# Patient Record
Sex: Female | Born: 1995 | Race: White | Hispanic: No | Marital: Married | Smoking: Never smoker
Health system: Southern US, Community
[De-identification: ages and names within clinical notes are randomized; demographics above are authoritative.]

## PROBLEM LIST (undated history)

## (undated) ENCOUNTER — Inpatient Hospital Stay (HOSPITAL_COMMUNITY): Payer: Self-pay

## (undated) DIAGNOSIS — D649 Anemia, unspecified: Secondary | ICD-10-CM

## (undated) DIAGNOSIS — J45909 Unspecified asthma, uncomplicated: Secondary | ICD-10-CM

## (undated) DIAGNOSIS — G43909 Migraine, unspecified, not intractable, without status migrainosus: Secondary | ICD-10-CM

## (undated) DIAGNOSIS — R51 Headache: Secondary | ICD-10-CM

## (undated) DIAGNOSIS — R519 Headache, unspecified: Secondary | ICD-10-CM

## (undated) HISTORY — DX: Migraine, unspecified, not intractable, without status migrainosus: G43.909

## (undated) HISTORY — PX: ADENOIDECTOMY: SUR15

## (undated) HISTORY — PX: DERMOID CYST  EXCISION: SHX1452

## (undated) HISTORY — PX: WISDOM TOOTH EXTRACTION: SHX21

## (undated) HISTORY — PX: TYMPANOSTOMY TUBE PLACEMENT: SHX32

## (undated) HISTORY — DX: Headache: R51

## (undated) HISTORY — PX: TONSILLECTOMY: SUR1361

## (undated) HISTORY — DX: Headache, unspecified: R51.9

---
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
## 2016-07-12 ENCOUNTER — Encounter: Payer: Self-pay | Admitting: Family

## 2016-07-12 ENCOUNTER — Ambulatory Visit (INDEPENDENT_AMBULATORY_CARE_PROVIDER_SITE_OTHER): Payer: BLUE CROSS/BLUE SHIELD | Admitting: Family

## 2016-07-12 DIAGNOSIS — R1030 Lower abdominal pain, unspecified: Secondary | ICD-10-CM | POA: Diagnosis not present

## 2016-07-12 DIAGNOSIS — M549 Dorsalgia, unspecified: Secondary | ICD-10-CM | POA: Diagnosis not present

## 2016-07-12 LAB — POCT URINALYSIS DIPSTICK
Bilirubin, UA: NEGATIVE
Blood, UA: NEGATIVE
GLUCOSE UA: NEGATIVE
KETONES UA: NEGATIVE
LEUKOCYTES UA: NEGATIVE
NITRITE UA: NEGATIVE
Protein, UA: NEGATIVE
Spec Grav, UA: 1.02
Urobilinogen, UA: NEGATIVE
pH, UA: 5

## 2016-07-12 NOTE — Progress Notes (Signed)
Subjective:     Patient ID: Valerie Black, female   DOB: [birth year], 20 y.o.   MRN: [id number]  HPI 20 y.o. Female presents with chief complaint of tape worm. She states that she has "horrible" stomach issues and they have been ongoing for over one year. She was diagnosed and treated for H. Pylori twice, although one time her stool culture for it was lost according to her mom. She reports that she has brief periods of constipation and then she will have normal stool, but her lower abdomen is always sore. She reports that it looks like there is "white tissue" in her stool. Over the past year she reports her weight has gone from 160 to 126 at its lowest. She feels like she is eating all the time but cannot gain weight back. She states that recently her lower back has been sore as well, she denies burning with urination. Denies fever, fatigue, SOB and change in activity.    Review of Systems  Constitutional: Negative.  Negative for activity change, appetite change, fatigue and fever.  HENT: Negative.  Negative for rhinorrhea, sinus pressure and sore throat.   Eyes: Negative.   Respiratory: Negative.  Negative for cough, chest tightness and shortness of breath.   Cardiovascular: Negative.  Negative for chest pain and palpitations.  Gastrointestinal: Positive for abdominal distention, abdominal pain and constipation. Negative for anal bleeding, blood in stool, diarrhea, nausea, rectal pain and vomiting.  Endocrine: Negative.  Negative for polydipsia, polyphagia and polyuria.  Genitourinary: Positive for flank pain. Negative for decreased urine volume, difficulty urinating, dysuria, frequency, hematuria, vaginal bleeding, vaginal discharge and vaginal pain.  Musculoskeletal: Positive for back pain. Negative for gait problem, joint swelling, myalgias, neck pain and neck stiffness.  Skin: Negative.  Negative for color change and rash.  Neurological: Negative.  Negative for dizziness, weakness,  light-headedness and headaches.  Hematological: Negative.    No past medical history on file.  Social History   Social History  . Marital status: Single    Spouse name: N/A  . Number of children: N/A  . Years of education: N/A   Occupational History  . Not on file.   Social History Main Topics  . Smoking status: Never Smoker  . Smokeless tobacco: Not on file  . Alcohol use Not on file  . Drug use: Unknown  . Sexual activity: Not on file   Other Topics Concern  . Not on file   Social History Narrative  . No narrative on file    Past Surgical History:  Procedure Laterality Date  . ADENOIDECTOMY    . DERMOID CYST  EXCISION     age 37 years (neck)  . TONSILLECTOMY    . TYMPANOSTOMY TUBE PLACEMENT     20 years of age    No family history on file.  No Known Allergies  Current Outpatient Prescriptions on File Prior to Visit  Medication Sig Dispense Refill  . albuterol (PROVENTIL HFA;VENTOLIN HFA) 108 (90 BASE) MCG/ACT inhaler Inhale 2 puffs into the lungs every 4 (four) hours as needed for wheezing or shortness of breath (cough, tight chest). 1 Inhaler 1  . beclomethasone (QVAR) 40 MCG/ACT inhaler Inhale 2 puffs into the lungs 2 (two) times daily. 1 Inhaler 12  . fluticasone (FLONASE) 50 MCG/ACT nasal spray Place 2 sprays into the nose daily. 16 g 1  . Lactobacillus Rhamnosus, GG, (CULTURELLE KIDS) CHEW Chew 1 capsule by mouth daily. One a day    . ondansetron [facility](ZOFRAN  ODT) 8 MG disintegrating tablet Take 1 tablet (8 mg total) by mouth every 8 (eight) hours as needed for nausea or vomiting. 20 tablet 0   No current facility-administered medications on file prior to visit.     There were no vitals taken for this visit.chart     Objective:   Physical Exam  Constitutional: She is oriented to person, place, and time. She is active.  Neck: Trachea normal, normal range of motion and full passive range of motion without pain. Neck supple.  Cardiovascular: Normal rate,  regular rhythm, normal heart sounds and normal pulses.   Pulmonary/Chest: Effort normal and breath sounds normal. She has no decreased breath sounds. She has no wheezes. She has no rhonchi. She has no rales.  Abdominal: Normal appearance and bowel sounds are normal. She exhibits no distension. There is no hepatosplenomegaly. There is tenderness in the right lower quadrant and left lower quadrant. There is no rigidity, no rebound, no guarding, no CVA tenderness, no tenderness at McBurney's point and negative Murphy's sign.  Lymphadenopathy:    She has no cervical adenopathy.  Neurological: She is alert and oriented to person, place, and time. She has normal strength.  Skin: Skin is warm, dry and intact. No rash noted.     Results for orders placed or performed in visit on 07/12/16  POCT urinalysis dipstick  Result Value Ref Range   Color, UA yellow    Clarity, UA clear    Glucose, UA neg    Bilirubin, UA neg    Ketones, UA neg    Spec Grav, UA 1.020    Blood, UA neg    pH, UA 5.0    Protein, UA neg    Urobilinogen, UA negative    Nitrite, UA neg    Leukocytes, UA Negative Negative       Assessment:     Abdominal pain (rule out parasite such as tape worm) Back pain     Plan:      UA normal  Will culture stool for parasites  Discussed adequate hydration and nutrition  Possible referral to GI pending stool culture.  Follow up as needed.

## 2016-07-12 NOTE — Patient Instructions (Signed)

## 2016-07-17 ENCOUNTER — Other Ambulatory Visit: Payer: Self-pay | Admitting: Pediatrics

## 2016-07-17 DIAGNOSIS — R1013 Epigastric pain: Secondary | ICD-10-CM

## 2016-07-27 ENCOUNTER — Telehealth: Payer: Self-pay | Admitting: Pediatrics

## 2016-07-27 NOTE — Telephone Encounter (Signed)
Mom would like the results of lab work she has called and no one has called her back.

## 2016-07-28 NOTE — Telephone Encounter (Signed)
Two phone calls to mom--left messages--she did not answer her phone

## 2016-07-29 ENCOUNTER — Telehealth: Payer: Self-pay | Admitting: Pediatrics

## 2016-07-29 NOTE — Telephone Encounter (Signed)
Mother stated that she missed your call yesterday and would like to know child's test results

## 2016-07-30 NOTE — Telephone Encounter (Signed)
Mom advised that results of stools were negative for ova and parasites

## 2017-01-27 HISTORY — PX: COSMETIC SURGERY: SHX468

## 2017-06-15 ENCOUNTER — Telehealth: Payer: Self-pay | Admitting: Pediatrics

## 2017-06-15 MED ORDER — MUPIROCIN 2 % EX OINT: 1.0000 "application " | TOPICAL_OINTMENT | Freq: Three times a day (TID) | CUTANEOUS | 0 refills | Status: DC

## 2017-06-15 MED ORDER — SULFAMETHOXAZOLE-TRIMETHOPRIM 800-160 MG PO TABS: 1.0000 | ORAL_TABLET | Freq: Two times a day (BID) | ORAL | 0 refills | Status: AC

## 2017-06-15 NOTE — Telephone Encounter (Signed)
Sister with impetigo and now she is starting to get areas on her face and body like she did.  She has her wedding in a week and they are worried it is going to get worse like her sisters did.  Denies any allergies to any medications.  Will send in antibiotics for treatment.

## 2017-07-06 ENCOUNTER — Ambulatory Visit (INDEPENDENT_AMBULATORY_CARE_PROVIDER_SITE_OTHER): Payer: BLUE CROSS/BLUE SHIELD | Admitting: Pediatrics

## 2017-07-06 ENCOUNTER — Encounter: Payer: Self-pay | Admitting: Pediatrics

## 2017-07-06 VITALS — Wt 132.6 lb

## 2017-07-06 DIAGNOSIS — L0100 Impetigo, unspecified: Secondary | ICD-10-CM | POA: Diagnosis not present

## 2017-07-06 MED ORDER — MUPIROCIN 2 % EX OINT: TOPICAL_OINTMENT | CUTANEOUS | 2 refills | Status: AC

## 2017-07-06 MED ORDER — CEPHALEXIN 500 MG PO CAPS: 500.0000 mg | ORAL_CAPSULE | Freq: Three times a day (TID) | ORAL | 0 refills | Status: AC

## 2017-07-06 NOTE — Progress Notes (Signed)
Presents with red papules to exposed area of body for the past three days. Low grade fever, no discharge, no swelling and no limitation of motion.   Review of Systems  Constitutional: Negative.  Negative for fever, activity change and appetite change.  HENT: Negative.  Negative for ear pain, congestion and rhinorrhea.   Eyes: Negative.   Respiratory: Negative.  Negative for cough and wheezing.   Cardiovascular: Negative.   Gastrointestinal: Negative.   Musculoskeletal: Negative.  Negative for myalgias, joint swelling and gait problem.  Neurological: Negative for numbness.  Hematological: Negative for adenopathy. Does not bruise/bleed easily.        Objective:   Physical Exam  Constitutional: Appears well-developed and well-nourished. Active. No distress.  HENT:  Right Ear: Tympanic membrane normal.  Left Ear: Tympanic membrane normal.  Nose: No nasal discharge.  Mouth/Throat: Mucous membranes are moist. No tonsillar exudate. Oropharynx is clear. Pharynx is normal.  Eyes: Pupils are equal, round, and reactive to light.  Neck: Normal range of motion. No adenopathy.  Cardiovascular: Regular rhythm.  No murmur heard. Pulmonary/Chest: Effort normal. No respiratory distress. She exhibits no retraction.  Abdominal: Soft. Bowel sounds are normal. Exhibits no distension.   Neurological: Alert and active.  Skin: Skin is warm. No petechiae. Papular rash with scabsto exposed skin loikely secondary to bug bites. No swelling, no erythema and no discharge.      Assessment:     Impetigo secondary to bug bites    Plan:   Will treat with oral keflex and  topical bactroban ointment and advised her on cutting nails and asked to avoid scratching.

## 2017-07-06 NOTE — Patient Instructions (Signed)
Impetigo, Adult Impetigo is an infection of the skin. It commonly occurs in young children, but it can also occur in adults. The infection causes itchy blisters and sores that produce brownish-yellow fluid. As the fluid dries, it forms a thick, honey-colored crust. These skin changes usually occur on the face but can also affect other areas of the body. Impetigo usually goes away in 7-10 days with treatment. What are the causes? Impetigo is caused by two types of bacteria. It may be caused by staphylococci or streptococci bacteria. These bacteria cause impetigo when they get under the surface of the skin. This often happens after some damage to the skin, such as damage from:  Cuts, scrapes, or scratches.  Insect bites, especially when you scratch the area of a bite.  Chickenpox or other illnesses that cause open skin sores.  Nail biting or chewing.  Impetigo is contagious and can spread easily from one person to another. This may occur through close skin contact or by sharing towels, clothing, or other items with a person who has the infection. What increases the risk? Some things that can increase the risk of getting this infection include:  Playing sports that include skin-to-skin contact with others.  Having a skin condition with open sores.  Having many skin cuts or scrapes.  Living in an area that has high humidity levels.  Having poor hygiene.  Having high levels of staphylococci in your nose.  What are the signs or symptoms? Impetigo usually starts out as small blisters, often on the face. The blisters then break open and turn into tiny sores (lesions) with a yellow crust. In some cases, the blisters cause itching or burning. With scratching, irritation, or lack of treatment, these small lesions may get larger. Scratching can also cause impetigo to spread to other parts of the body. The bacteria can get under the fingernails and spread when you touch another area of your  skin. Other possible symptoms include:  Larger blisters.  Pus.  Swollen lymph glands.  How is this diagnosed? This condition is usually diagnosed during a physical exam. A skin sample or sample of fluid from a blister may be taken for lab tests that involve growing bacteria (culture test). This can help confirm the diagnosis or help determine the best treatment. How is this treated? Mild impetigo can be treated with prescription antibiotic cream. Oral antibiotic medicine may be used in more severe cases. Medicines for itching may also be used. Follow these instructions at home:  Take medicines only as directed by your health care provider.  To help prevent impetigo from spreading to other body areas: ? Keep your fingernails short and clean. ? Do not scratch the blisters or sores. ? Cover infected areas, if necessary, to keep from scratching.  Gently wash the infected areas with antibiotic soap and water.  Soak crusted areas in warm, soapy water using antibiotic soap. ? Gently rub the areas to remove crusts. Do not scrub.  Wash your hands often to avoid spreading this infection.  Stay home until you have used an antibiotic cream for 48 hours (2 days) or an oral antibiotic medicine for 24 hours (1 day). You should only return to work and activities with other people if your skin shows significant improvement. How is this prevented? To keep the infection from spreading:  Stay home until you have used an antibiotic cream for 48 hours or an oral antibiotic for 24 hours.  Wash your hands often.  Do not engage in   skin-to-skin contact with other people while you have still have blisters.  Do not share towels, washcloths, or bedding with others while you have the infection.  Contact a health care provider if:  You develop more blisters or sores despite treatment.  Other family members get sores.  Your skin sores are not improving after 48 hours of treatment.  You have a  fever. Get help right away if:  You see spreading redness or swelling of the skin around your sores.  You see red streaks coming from your sores.  You develop a sore throat. This information is not intended to replace advice given to you by your health care provider. Make sure you discuss any questions you have with your health care provider. Document Released: 12/06/2014 Document Revised: 04/22/2016 Document Reviewed: 10/29/2014 Elsevier Interactive Patient Education  2017 Elsevier Inc[employer]  

## 2018-05-18 ENCOUNTER — Ambulatory Visit: Payer: BLUE CROSS/BLUE SHIELD | Admitting: Family Medicine

## 2018-05-18 ENCOUNTER — Encounter: Payer: Self-pay | Admitting: Family Medicine

## 2018-05-18 VITALS — BP 120/70 | HR 68 | Temp 98.7°F | Ht 69.0 in | Wt 140.5 lb

## 2018-05-18 DIAGNOSIS — L03312 Cellulitis of back [any part except buttock]: Secondary | ICD-10-CM | POA: Diagnosis not present

## 2018-05-18 MED ORDER — DOXYCYCLINE HYCLATE 100 MG PO TABS
100.0000 mg | ORAL_TABLET | Freq: Two times a day (BID) | ORAL | 0 refills | Status: DC
Start: 2018-05-18 — End: 2018-06-14

## 2018-05-18 NOTE — Patient Instructions (Signed)
Doxycycline tablets or capsules What is this medicine? DOXYCYCLINE (dox i SYE kleen) is a tetracycline antibiotic. It kills certain bacteria or stops their growth. It is used to treat many kinds of infections, like dental, skin, respiratory, and urinary tract infections. It also treats acne, Lyme disease, malaria, and certain sexually transmitted infections. This medicine may be used for other purposes; ask your health care provider or pharmacist if you have questions. COMMON BRAND NAME(S): Acticlate, Adoxa, Adoxa CK, Adoxa Pak, Adoxa TT, Alodox, Avidoxy, Doxal, Mondoxyne NL, Monodox, Morgidox 1x, Morgidox 1x Kit, Morgidox 2x, Morgidox 2x Kit, NutriDox, Ocudox, TARGADOX, Vibra-Tabs, Vibramycin What should I tell my health care provider before I take this medicine? They need to know if you have any of these conditions: -liver disease -long exposure to sunlight like working outdoors -stomach problems like colitis -an unusual or allergic reaction to doxycycline, tetracycline antibiotics, other medicines, foods, dyes, or preservatives -pregnant or trying to get pregnant -breast-feeding How should I use this medicine? Take this medicine by mouth with a full glass of water. Follow the directions on the prescription label. It is best to take this medicine without food, but if it upsets your stomach take it with food. Take your medicine at regular intervals. Do not take your medicine more often than directed. Take all of your medicine as directed even if you think you are better. Do not skip doses or stop your medicine early. Talk to your pediatrician regarding the use of this medicine in children. While this drug may be prescribed for selected conditions, precautions do apply. Overdosage: If you think you have taken too much of this medicine contact a poison control center or emergency room at once. NOTE: This medicine is only for you. Do not share this medicine with others. What if I miss a dose? If you  miss a dose, take it as soon as you can. If it is almost time for your next dose, take only that dose. Do not take double or extra doses. What may interact with this medicine? -antacids -barbiturates -birth control pills -bismuth subsalicylate -carbamazepine -methoxyflurane -other antibiotics -phenytoin -vitamins that contain iron -warfarin This list may not describe all possible interactions. Give your health care provider a list of all the medicines, herbs, non-prescription drugs, or dietary supplements you use. Also tell them if you smoke, drink alcohol, or use illegal drugs. Some items may interact with your medicine. What should I watch for while using this medicine? Tell your doctor or health care professional if your symptoms do not improve. Do not treat diarrhea with over the counter products. Contact your doctor if you have diarrhea that lasts more than 2 days or if it is severe and watery. Do not take this medicine just before going to bed. It may not dissolve properly when you lay down and can cause pain in your throat. Drink plenty of fluids while taking this medicine to also help reduce irritation in your throat. This medicine can make you more sensitive to the sun. Keep out of the sun. If you cannot avoid being in the sun, wear protective clothing and use sunscreen. Do not use sun lamps or tanning beds/booths. Birth control pills may not work properly while you are taking this medicine. Talk to your doctor about using an extra method of birth control. If you are being treated for a sexually transmitted infection, avoid sexual contact until you have finished your treatment. Your sexual partner may also need treatment. Avoid antacids, aluminum, calcium, magnesium, and  iron products for 4 hours before and 2 hours after taking a dose of this medicine. If you are using this medicine to prevent malaria, you should still protect yourself from contact with mosquitos. Stay in screened-in  areas, use mosquito nets, keep your body covered, and use an insect repellent. What side effects may I notice from receiving this medicine? Side effects that you should report to your doctor or health care professional as soon as possible: -allergic reactions like skin rash, itching or hives, swelling of the face, lips, or tongue -difficulty breathing -fever -itching in the rectal or genital area -pain on swallowing -redness, blistering, peeling or loosening of the skin, including inside the mouth -severe stomach pain or cramps -unusual bleeding or bruising -unusually weak or tired -yellowing of the eyes or skin Side effects that usually do not require medical attention (report to your doctor or health care professional if they continue or are bothersome): -diarrhea -loss of appetite -nausea, vomiting This list may not describe all possible side effects. Call your doctor for medical advice about side effects. You may report side effects to FDA at 1-800-FDA-1088. Where should I keep my medicine? Keep out of the reach of children. Store at room temperature, below 30 degrees C (86 degrees F). Protect from light. Keep container tightly closed. Throw away any unused medicine after the expiration date. Taking this medicine after the expiration date can make you seriously ill. NOTE: This sheet is a summary. It may not cover all possible information. If you have questions about this medicine, talk to your doctor, pharmacist, or health care provider.  2018 Elsevier/Gold Standard (2015-12-17 17:11:22)

## 2018-05-18 NOTE — Progress Notes (Signed)
Subjective:  Patient ID: Valerie Black, female    DOB: [birth year]  Age: 22 y.o. MRN: [id number]  CC: Establish Care (boil under right arm)   HPI Valerie Black presents for 2 to 3-day history of the swelling in her right upper back area.  There is been scant discharge.  There is been no streaking fever or chills.  Distant history of infected hair follicles.  She has no history of MRSA that she is aware of.  Outpatient Medications Prior to Visit  Medication Sig Dispense Refill  . TRI-PREVIFEM 0.18/0.215/0.25 MG-35 MCG tablet Take 1 tablet by mouth daily.  1  . albuterol (PROVENTIL HFA;VENTOLIN HFA) 108 (90 BASE) MCG/ACT inhaler Inhale 2 puffs into the lungs every 4 (four) hours as needed for wheezing or shortness of breath (cough, tight chest). 1 Inhaler 1  . beclomethasone (QVAR) 40 MCG/ACT inhaler Inhale 2 puffs into the lungs 2 (two) times daily. 1 Inhaler 12  . fluticasone (FLONASE) 50 MCG/ACT nasal spray Place 2 sprays into the nose daily. 16 g 1  . Lactobacillus Rhamnosus, GG, (CULTURELLE KIDS) CHEW Chew 1 capsule by mouth daily. One a day    . mupirocin ointment (BACTROBAN) 2 % Apply 1 application topically 3 (three) times daily. 22 g 0  . ondansetron (ZOFRAN ODT) 8 MG disintegrating tablet Take 1 tablet (8 mg total) by mouth every 8 (eight) hours as needed for nausea or vomiting. 20 tablet 0   No facility-administered medications prior to visit.     ROS Review of Systems  Constitutional: Negative for appetite change, chills, fatigue, fever and unexpected weight change.  Skin: Negative for color change, pallor, rash and wound.  Allergic/Immunologic: Negative for immunocompromised state.  Hematological: Negative for adenopathy. Does not bruise/bleed easily.  Psychiatric/Behavioral: Negative.     Objective:  BP 120/70   Pulse 68   Temp 98.7 F (37.1 C)   Ht 5\' 9"  (1.753 m)   Wt 140 lb 8 oz (63.7 kg)   LMP 05/04/2018 (Exact Date)   SpO2 97%   BMI 20.75 kg/m   BP  Readings from Last 3 Encounters:  05/18/18 120/70  06/12/15 117/59    Wt Readings from Last 3 Encounters:  05/18/18 140 lb 8 oz (63.7 kg)  07/06/17 132 lb 9.6 oz (60.1 kg) (58 %, Z= 0.20)*  06/12/15 130 lb 8.2 oz (59.2 kg) (63 %, Z= 0.33)*   * Growth percentiles are based on CDC (Girls, 2-20 Years) data.    Physical Exam  Constitutional: She is oriented to person, place, and time. She appears well-developed and well-nourished. No distress.  HENT:  Head: Normocephalic and atraumatic.  Right Ear: External ear normal.  Eyes: Right eye exhibits no discharge. Left eye exhibits no discharge. No scleral icterus.  Pulmonary/Chest: Breath sounds normal.  Neurological: She is alert and oriented to person, place, and time.  Skin: Skin is warm and dry. She is not diaphoretic.       Lab Results  Component Value Date   WBC 6.7 06/12/2015   HGB 13.2 06/12/2015   HCT 39.4 06/12/2015   PLT 170 06/12/2015   GLUCOSE 100 (H) 06/12/2015   ALT 14 06/12/2015   AST 17 06/12/2015   NA 141 06/12/2015   K 3.8 06/12/2015   CL 105 06/12/2015   CREATININE 0.74 06/12/2015   BUN 10 06/12/2015   CO2 28 06/12/2015    Ct Abdomen Pelvis W Contrast  Result Date: 06/12/2015 CLINICAL DATA:  22 year old with abdominal pain  and vomiting for 2 days. Fatigue. No response to antibiotic therapy. Initial encounter. EXAM: CT ABDOMEN AND PELVIS WITH CONTRAST TECHNIQUE: Multidetector CT imaging of the abdomen and pelvis was performed using the standard protocol following bolus administration of intravenous contrast. CONTRAST:  100mL OMNIPAQUE IOHEXOL 300 MG/ML  SOLN COMPARISON:  None. FINDINGS: Lower chest: Clear lung bases. No significant pleural or pericardial effusion. Hepatobiliary: The liver is normal in density without focal abnormality. No evidence of gallstones, gallbladder wall thickening or biliary dilatation. Pancreas: Unremarkable. No pancreatic ductal dilatation or surrounding inflammatory changes. Spleen:  Normal in size without focal abnormality. Adrenals/Urinary Tract: Both adrenal glands appear normal.The kidneys appear normal without evidence of urinary tract calculus, suspicious lesion or hydronephrosis. No bladder abnormalities are seen. Stomach/Bowel: No evidence of bowel wall thickening, distention or surrounding inflammatory change.The appendix appears normal, best seen on coronal images 46 and 47. Vascular/Lymphatic: Small ileocolonic mesenteric lymph nodes are not pathologically enlarged. No significant vascular findings are present. Reproductive: The uterus and ovaries appear normal. The uterus is retroverted. No inflammatory changes demonstrated within the pelvis. Other: No evidence of abdominal wall mass or hernia. Musculoskeletal: No acute or significant osseous findings. IMPRESSION: No acute findings or explanation for the patient's symptoms. The appendix appears normal. Electronically Signed   By: Valerie BullocksWilliam  Black M.D.   On: 06/12/2015 15:39    Assessment & Plan:   Valerie Black was seen today for establish care.  Diagnoses and all orders for this visit:  Cellulitis of back except buttock -     doxycycline (VIBRA-TABS) 100 MG tablet; Take 1 tablet (100 mg total) by mouth 2 (two) times daily.   I have discontinued Valerie Black's fluticasone, albuterol, beclomethasone, CULTURELLE KIDS, ondansetron, and mupirocin ointment. I am also having her start on doxycycline. Additionally, I am having her maintain her TRI-PREVIFEM.  Meds ordered this encounter  Medications  . doxycycline (VIBRA-TABS) 100 MG tablet    Sig: Take 1 tablet (100 mg total) by mouth 2 (two) times daily.    Dispense:  20 tablet    Refill:  0   Discussed treatment options with the patient.  First option is to treated with antibiotics and warm compresses for the next 2 to 3 days.  It may become worse and need drainage.  She will return to see me or go to urgent care.  It may open on his own and drain or resolve.  The  second option would be to for me to attempt to drain it today.  I explained the procedure to the patient.  The yield may be minimal but it could still help.  Patient elected to go with the first option.  She will use warm compresses 3 times a day.  She will follow-up with here with me or an urgent care depending on availability as needed.  Light caution emphasized.  Follow-up: Return in about 3 days (around 05/21/2018), or if symptoms worsen or fail to improve.  Mliss SaxWilliam Alfred Cristian Grieves, MD

## 2018-06-14 ENCOUNTER — Ambulatory Visit: Payer: BLUE CROSS/BLUE SHIELD | Admitting: Family Medicine

## 2018-06-14 ENCOUNTER — Encounter: Payer: Self-pay | Admitting: Family Medicine

## 2018-06-14 VITALS — BP 110/70 | HR 73 | Temp 98.0°F | Ht 69.0 in | Wt 140.2 lb

## 2018-06-14 DIAGNOSIS — A4902 Methicillin resistant Staphylococcus aureus infection, unspecified site: Secondary | ICD-10-CM | POA: Insufficient documentation

## 2018-06-14 MED ORDER — MUPIROCIN 2 % EX OINT: 1.0000 "application " | TOPICAL_OINTMENT | Freq: Two times a day (BID) | CUTANEOUS | 0 refills | Status: AC

## 2018-06-14 MED ORDER — CHLORHEXIDINE GLUCONATE 4 % EX LIQD: CUTANEOUS | 0 refills | Status: DC

## 2018-06-14 MED ORDER — DOXYCYCLINE HYCLATE 100 MG PO TABS
100.0000 mg | ORAL_TABLET | Freq: Two times a day (BID) | ORAL | 0 refills | Status: DC
Start: 2018-06-14 — End: 2018-08-11

## 2018-06-14 NOTE — Patient Instructions (Signed)
Community-Associated MRSA °MRSA stands for methicillin-resistant Staphylococcus aureus. It is a type ofinfection caused by bacteria that are no longer affected by common antibiotic medicines (drug-resistant bacteria). °Infections with MRSA can occur in hospitals and other health care settings (healthcare-associated MRSA), and in the community (community-associated MRSA, or CA-MRSA). MRSA bacteria can spread from person to person by touching contaminated objects or through direct contact. Infections with MRSA may be very serious or even life-threatening. °What are the causes? °Staphylococcus aureus bacteria normally live on the skin or in the nose of some people. This usually does not cause problems. However, a MRSA infection can happen if the bacterium enters the body through a cut, wound, or break in the skin. °What increases the risk? °The following factors may make you more likely to get a CA-MRSA infection: °· Close skin-to-skin contact with others. °· Cuts and scratches that are not treated, not covered, or both. °· Recent antibiotic medicine use. °· Sharing contaminated towels or clothes. °· Having active skin conditions. °· Participating in contact sports. °· Living in crowded settings. °· Homelessness. °· IV drug use. ° °What are the signs or symptoms? °This condition usually starts with a skin infection. Signs and symptoms vary, and may include: °· An area of skin that is red and swollen, feels painful, and is warm to the touch. °· Pus under the skin or pus draining from the infected area. °· Fever. ° °CA-MRSA infections are usually skin infections, but in some cases, severe illness may develop, such as: °· Pneumonia. °· Bone or joint infections. °· Bloodstream infections (sepsis). ° °Symptoms may vary as the infection gets worse. °How is this diagnosed? °This condition is diagnosed by taking a fluid sample (culture). The culture may come from: °· Swabs of cuts or wounds in infected areas. °· Nasal  swabs. °· Saliva or deep-cough specimens from the lungs (sputum). °· Urine. °· Blood. ° °You may have imaging tests to check whether the infection has spread. Imaging tests may include: °· X-rays. °· MRI. °· CT scan. ° °How is this treated? °Treatment varies and is based on how serious, deep, and extensive the infection is. For example: °· Some skin infections, such as a small boil or abscess, may be treated by draining pus from the site of the infection. °· Deeper or more widespread soft tissue infections are usually treated with surgery to drain pus and with antibiotics that are given through a vein or by mouth. This may be recommended even if you are pregnant. °· Serious infections may require a hospital stay. ° °If antibiotics are prescribed, you may need to take them for several weeks. °Follow these instructions at home: °Medicines °· Take your antibiotic as told by your health care provider. Do not stop taking the antibiotic even if you start to feel better. °· Take over-the-counter and prescription medicines only as told by your health care provider. °General instructions °· Wash your hands with soap and water often. Ask anyone who lives with you to wash their hands often, too. If soap and water are not available, use hand sanitizer. °· Do not use towels, razors, toothbrushes, bedding, or other items that will be used by others. °· Follow instructions from your health care provider about how to take care of your wound. Make sure you: °? Wash your hands with soap and water before you change your bandage (dressing). If soap and water are not available, use hand sanitizer. °? Change your dressing as told by your health care provider. °? Leave   stitches (sutures), skin glue, or adhesive strips in place. These skin closures may need to be in place for 2 weeks or longer. If adhesive strip edges start to loosen and curl up, you may trim the loose edges. Do not remove adhesive strips completely unless your health care  provider tells you to do that. °· Tell any health care providers who care for you that you have MRSA so they are aware of your infection. °· Keep all follow-up visits as told by your health care provider. This is important. °How is this prevented? ° °· Wash your hands frequently with soap and water for at least 20 seconds. If soap and water are not available, use hand sanitizer. Make sure that everyone in your household washes their hands, too. °· Wash and dry your clothes and bedding at the warmest temperatures that are recommended on the labels. °· Maintain good hygiene by bathing often and keeping your body clean. °· Clean wounds, cuts, and abrasions with soap and water and cover them with dry, germ-free (sterile) dressings until they heal. °· If you have a wound that seems to be infected, ask your health care provider if a culture should be done for MRSA and other bacteria. °· If you are breastfeeding, talk to your health care provider about MRSA. You may be asked to temporarily stop breastfeeding. °Contact a health care provider if: °· Your infection seems to be getting worse. Signs may include: °? More warmth, redness, or tenderness around your wound site. °? A red line that spreads from your infection site. °? A dark color in the area around your infection. °? Wound drainage that is tan, yellow, or green. °? A bad smell coming from your wound. °· You feel nauseous, you vomit, or you cannot take medicine without vomiting. °· You have a fever. °· You have difficulty breathing. °This information is not intended to replace advice given to you by your health care provider. Make sure you discuss any questions you have with your health care provider. °Document Released: 02/18/2006 Document Revised: 07/09/2016 Document Reviewed: 11/20/2015 °Elsevier Interactive Patient Education © 2017 [employer] ° °

## 2018-06-14 NOTE — Progress Notes (Signed)
Subjective:  Patient ID: Anselm JunglingAnnaLee R Carroll, female    DOB: [birth year]  Age: 22 y.o. MRN: [id number]  CC: Lung Abcess   HPI Israella Luster LandsbergR Taliercio presents for evaluation and treatment of 3 tender spots in her right underarm area.  This involved relatively quickly over the last 2 to 3 days.  They are sore tender and seem to be inflamed.  There was no injury to this area other than shaving her underarms.  She is running no fever with this.  These areas are not drained.  There is no nausea or vomiting, fever or chills.  She is treated them with warm compresses.  She saw me for a similar lesion 2 weeks ago that resolved with doxycycline therapy.  She had no problems taking the doxycycline.  She is married and lives with her husband.  He is not affected.  Outpatient Medications Prior to Visit  Medication Sig Dispense Refill  . TRI-PREVIFEM 0.18/0.215/0.25 MG-35 MCG tablet Take 1 tablet by mouth daily.  1  . doxycycline (VIBRA-TABS) 100 MG tablet Take 1 tablet (100 mg total) by mouth 2 (two) times daily. 20 tablet 0   No facility-administered medications prior to visit.     ROS Review of Systems  Constitutional: Negative for chills, fever and unexpected weight change.  HENT: Negative.   Eyes: Negative.   Respiratory: Negative.   Cardiovascular: Negative.   Gastrointestinal: Negative.   Genitourinary: Negative for difficulty urinating and dysuria.  Musculoskeletal: Negative for arthralgias and myalgias.  Skin: Positive for color change and rash. Negative for pallor and wound.  Allergic/Immunologic: Negative for immunocompromised state.  Neurological: Negative for weakness and headaches.  Hematological: Negative for adenopathy. Does not bruise/bleed easily.  Psychiatric/Behavioral: Negative.     Objective:  BP 110/70   Pulse 73   Temp 98 F (36.7 C)   Ht 5\' 9"  (1.753 m)   Wt 140 lb 4 oz (63.6 kg)   SpO2 97%   BMI 20.71 kg/m   BP Readings from Last 3 Encounters:  06/14/18 110/70    05/18/18 120/70  06/12/15 117/59    Wt Readings from Last 3 Encounters:  06/14/18 140 lb 4 oz (63.6 kg)  05/18/18 140 lb 8 oz (63.7 kg)  07/06/17 132 lb 9.6 oz (60.1 kg) (58 %, Z= 0.20)*   * Growth percentiles are based on CDC (Girls, 2-20 Years) data.    Physical Exam  Constitutional: She is oriented to person, place, and time. She appears well-developed and well-nourished. No distress.  HENT:  Head: Normocephalic and atraumatic.  Right Ear: External ear normal.  Left Ear: External ear normal.  Nose: Nose normal.  Eyes: Right eye exhibits no discharge. Left eye exhibits no discharge. No scleral icterus.  Neck: No tracheal deviation present.  Pulmonary/Chest: Effort normal.  Neurological: She is alert and oriented to person, place, and time.  Skin: Skin is warm and dry. Rash noted. She is not diaphoretic. There is erythema. No pallor.  Psychiatric: She has a normal mood and affect. Her behavior is normal.    Lab Results  Component Value Date   WBC 6.7 06/12/2015   HGB 13.2 06/12/2015   HCT 39.4 06/12/2015   PLT 170 06/12/2015   GLUCOSE 100 (H) 06/12/2015   ALT 14 06/12/2015   AST 17 06/12/2015   NA 141 06/12/2015   K 3.8 06/12/2015   CL 105 06/12/2015   CREATININE 0.74 06/12/2015   BUN 10 06/12/2015   CO2 28 06/12/2015  Ct Abdomen Pelvis W Contrast  Result Date: 06/12/2015 CLINICAL DATA:  22 year old with abdominal pain and vomiting for 2 days. Fatigue. No response to antibiotic therapy. Initial encounter. EXAM: CT ABDOMEN AND PELVIS WITH CONTRAST TECHNIQUE: Multidetector CT imaging of the abdomen and pelvis was performed using the standard protocol following bolus administration of intravenous contrast. CONTRAST:  OMNIPAQUE IOHEXOL 300 MG/ML  SOLN COMPARISON:  None. FINDINGS: Lower chest: Clear lung bases. No significant pleural or pericardial effusion. Hepatobiliary: The liver is normal in density without focal abnormality. No evidence of gallstones,  gallbladder wall thickening or biliary dilatation. Pancreas: Unremarkable. No pancreatic ductal dilatation or surrounding inflammatory changes. Spleen: Normal in size without focal abnormality. Adrenals/Urinary Tract: Both adrenal glands appear normal.The kidneys appear normal without evidence of urinary tract calculus, suspicious lesion or hydronephrosis. No bladder abnormalities are seen. Stomach/Bowel: No evidence of bowel wall thickening, distention or surrounding inflammatory change.The appendix appears normal, best seen on coronal images 46 and 47. Vascular/Lymphatic: Small ileocolonic mesenteric lymph nodes are not pathologically enlarged. No significant vascular findings are present. Reproductive: The uterus and ovaries appear normal. The uterus is retroverted. No inflammatory changes demonstrated within the pelvis. Other: No evidence of abdominal wall mass or hernia. Musculoskeletal: No acute or significant osseous findings. IMPRESSION: No acute findings or explanation for the patient's symptoms. The appendix appears normal. Electronically Signed   By: Carey Bullocks M.D.   On: 06/12/2015 15:39    Assessment & Plan:   Geselle was seen today for lung abcess.  Diagnoses and all orders for this visit:  MRSA (methicillin resistant Staphylococcus aureus) -     Wound culture -     doxycycline (VIBRA-TABS) 100 MG tablet; Take 1 tablet (100 mg total) by mouth 2 (two) times daily. -     mupirocin ointment (BACTROBAN) 2 %; Place 1 application into the nose 2 (two) times daily for 5 days. -     chlorhexidine (HIBICLENS) 4 % external liquid; Apply to body from neck down and allow to set for 10 minutes and then wash off.   I have discontinued Caylynn R. Dossett's doxycycline. I am also having her start on doxycycline, mupirocin ointment, and chlorhexidine. Additionally, I am having her maintain her TRI-PREVIFEM.  Meds ordered this encounter  Medications  . doxycycline (VIBRA-TABS) 100 MG tablet     Sig: Take 1 tablet (100 mg total) by mouth 2 (two) times daily.    Dispense:  20 tablet    Refill:  0  . mupirocin ointment (BACTROBAN) 2 %    Sig: Place 1 application into the nose 2 (two) times daily for 5 days.    Dispense:  22 g    Refill:  0  . chlorhexidine (HIBICLENS) 4 % external liquid    Sig: Apply to body from neck down and allow to set for 10 minutes and then wash off.    Dispense:  120 mL    Refill:  0   Procedure: Pustule in rt axillary area was cw etoh prep x 3. #15 blade was used to unroof pustule and pus was drained from wound with direct pressure and collected for culture.   Information was given to the patient about MRSA.  She will follow-up as needed.  Follow-up: Return if symptoms worsen or fail to improve.  Mliss Sax, MD

## 2018-06-17 LAB — WOUND CULTURE
MICRO NUMBER:: 90847979
SPECIMEN QUALITY:: ADEQUATE

## 2018-07-21 ENCOUNTER — Other Ambulatory Visit: Payer: Self-pay

## 2018-07-21 ENCOUNTER — Emergency Department (HOSPITAL_COMMUNITY): Payer: BLUE CROSS/BLUE SHIELD

## 2018-07-21 ENCOUNTER — Emergency Department (HOSPITAL_COMMUNITY)
Admission: EM | Admit: 2018-07-21 | Discharge: 2018-07-21 | Disposition: A | Discharge status: Home / Self Care | Payer: BLUE CROSS/BLUE SHIELD | Attending: Emergency Medicine | Admitting: Emergency Medicine

## 2018-07-21 ENCOUNTER — Encounter (HOSPITAL_COMMUNITY): Payer: Self-pay | Admitting: Emergency Medicine

## 2018-07-21 DIAGNOSIS — M25511 Pain in right shoulder: Secondary | ICD-10-CM | POA: Diagnosis present

## 2018-07-21 DIAGNOSIS — Y92410 Unspecified street and highway as the place of occurrence of the external cause: Secondary | ICD-10-CM | POA: Diagnosis not present

## 2018-07-21 DIAGNOSIS — G44309 Post-traumatic headache, unspecified, not intractable: Secondary | ICD-10-CM | POA: Diagnosis not present

## 2018-07-21 DIAGNOSIS — T148XXA Other injury of unspecified body region, initial encounter: Secondary | ICD-10-CM

## 2018-07-21 DIAGNOSIS — Y9389 Activity, other specified: Secondary | ICD-10-CM | POA: Insufficient documentation

## 2018-07-21 DIAGNOSIS — S46911A Strain of unspecified muscle, fascia and tendon at shoulder and upper arm level, right arm, initial encounter: Secondary | ICD-10-CM | POA: Diagnosis not present

## 2018-07-21 DIAGNOSIS — Y999 Unspecified external cause status: Secondary | ICD-10-CM | POA: Diagnosis not present

## 2018-07-21 LAB — URINALYSIS, ROUTINE W REFLEX MICROSCOPIC
BILIRUBIN URINE: NEGATIVE
Glucose, UA: NEGATIVE mg/dL
Hgb urine dipstick: NEGATIVE
KETONES UR: NEGATIVE mg/dL
Leukocytes, UA: NEGATIVE
NITRITE: NEGATIVE
PROTEIN: NEGATIVE mg/dL
Specific Gravity, Urine: 1.026 (ref 1.005–1.030)
pH: 6 (ref 5.0–8.0)

## 2018-07-21 LAB — POC URINE PREG, ED: PREG TEST UR: NEGATIVE

## 2018-07-21 MED ORDER — ACETAMINOPHEN 500 MG PO TABS
1000.0000 mg | ORAL_TABLET | Freq: Once | ORAL | Status: AC
  Administered 2018-07-21: 1000 mg via ORAL
  Filled 2018-07-21: qty 2

## 2018-07-21 MED ORDER — ONDANSETRON 4 MG PO TBDP
4.0000 mg | ORAL_TABLET | Freq: Once | ORAL | Status: AC
  Administered 2018-07-21: 4 mg via ORAL
  Filled 2018-07-21: qty 1

## 2018-07-21 MED ORDER — IBUPROFEN 800 MG PO TABS
800.0000 mg | ORAL_TABLET | Freq: Once | ORAL | Status: AC
  Administered 2018-07-21: 800 mg via ORAL
  Filled 2018-07-21: qty 1

## 2018-07-21 MED ORDER — METHOCARBAMOL 500 MG PO TABS: 500.0000 mg | ORAL_TABLET | Freq: Every evening | ORAL | 0 refills | Status: DC | PRN
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
## 2018-07-21 NOTE — Discharge Instructions (Signed)
Take ibuprofen 3 times a day with meals. Take 800 mg (4 tablets) at a time.  Do not take other anti-inflammatories at the same time (Advil, Motrin, naproxen, Aleve). You may supplement with Tylenol 3 times a day as well if you need further pain control. Use robaxin as needed for muscle stiffness or soreness.  Have caution, this may make you tired or groggy.  Do not drive or operate heavy machinery while taking this medicine. Use ice packs or heating pads if this helps control your pain. You will likely have continued muscle stiffness and soreness over the next couple days.  Follow-up with primary care in 1 week if your symptoms are not improving. Return to the emergency room if you develop vision changes, vomiting, slurred speech, numbness, loss of bowel or bladder control, or any new or worsening symptoms.

## 2018-08-11 ENCOUNTER — Ambulatory Visit: Payer: BLUE CROSS/BLUE SHIELD | Admitting: Nurse Practitioner

## 2018-08-11 ENCOUNTER — Encounter: Payer: Self-pay | Admitting: Nurse Practitioner

## 2018-08-11 VITALS — BP 102/62 | HR 88 | Temp 97.9°F | Ht 69.0 in | Wt 142.0 lb

## 2018-08-11 DIAGNOSIS — M545 Low back pain, unspecified: Secondary | ICD-10-CM

## 2018-08-11 DIAGNOSIS — R300 Dysuria: Secondary | ICD-10-CM | POA: Diagnosis not present

## 2018-08-11 LAB — POCT URINALYSIS DIPSTICK
Bilirubin, UA: NEGATIVE
Blood, UA: NEGATIVE
Glucose, UA: NEGATIVE
KETONES UA: NEGATIVE
Nitrite, UA: NEGATIVE
Protein, UA: POSITIVE — AB
SPEC GRAV UA: 1.015 (ref 1.010–1.025)
Urobilinogen, UA: 0.2 E.U./dL
pH, UA: 6 (ref 5.0–8.0)

## 2018-08-11 LAB — POCT URINE PREGNANCY: Preg Test, Ur: NEGATIVE

## 2018-08-11 MED ORDER — CEPHALEXIN 500 MG PO CAPS: 500.0000 mg | ORAL_CAPSULE | Freq: Two times a day (BID) | ORAL | 0 refills | Status: DC

## 2018-08-11 MED ORDER — PHENAZOPYRIDINE HCL 95 MG PO TABS: 95.0000 mg | ORAL_TABLET | Freq: Three times a day (TID) | ORAL | 0 refills | Status: DC | PRN
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
## 2018-08-11 NOTE — Patient Instructions (Signed)

## 2018-08-13 LAB — URINE CULTURE
MICRO NUMBER: 91100247
SPECIMEN QUALITY:: ADEQUATE
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
## 2021-11-29 NOTE — L&D Delivery Note (Signed)
   Delivery Note:   G1P0 at [redacted]w[redacted]d  Admitting diagnosis: Normal labor [O80, Z37.9] Risks: PPROM, chorioamnionitis, elevated AFP, gestational thrombocytopenia Onset of labor: 07/27/2022 at 0345 IOL/Augmentation: Pitocin, Cytotec, and IP Foley ROM: 07/27/2022 at 0345, clear fluid  Complete dilation at 07/27/2022  2036 Onset of pushing at 2038 FHR second stage Cat II, variables with pushing, good return to baseline  Analgesia/Anesthesia intrapartum:Epidural  Pushing in lithotomy position with CNM and L&D staff support. Husband, Samuel Bouche, present for birth and supportive.  Delivery of a Live born female  Birth Weight:  pending APGAR: 8, 9  Newborn Delivery   Birth date/time: 07/27/2022 20:58:00 Delivery type: Vaginal, Spontaneous     in cephalic presentation, position OA to LOA.  APGAR:1 min-8 , 5 min-9   Nuchal Cord: No  Cord double clamped after cessation of pulsation, cut by CNM.  Collection of cord blood for typing completed. Arterial cord blood sample-No    Placenta delivered-Spontaneous;Pathology  with 3 vessels . Uterotonics: Pitocin Placenta to pathology Uterine tone firm  Bleeding scant  None  laceration identified.  Episiotomy:None  Local analgesia: N/A  Repair: Skid mark approximated with figure of 8 stitch Est. Blood Loss (mL):126.00   Complications: Intrauterine Inflammation or infection (Chorioamniotis)  Mom to postpartum. Baby [name] to Couplet care / Skin to Skin.  Delivery Report:   Review the Delivery Report for details.    June Leap, CNM, MSN 07/27/2022, 9:18 PM

## 2022-01-28 LAB — OB RESULTS CONSOLE HEPATITIS B SURFACE ANTIGEN: Hepatitis B Surface Ag: NEGATIVE

## 2022-01-28 LAB — OB RESULTS CONSOLE VARICELLA ZOSTER ANTIBODY, IGG: Varicella: IMMUNE

## 2022-01-28 LAB — OB RESULTS CONSOLE GC/CHLAMYDIA
Chlamydia: NEGATIVE
Neisseria Gonorrhea: NEGATIVE

## 2022-01-28 LAB — OB RESULTS CONSOLE RPR: RPR: NONREACTIVE

## 2022-01-28 LAB — OB RESULTS CONSOLE HIV ANTIBODY (ROUTINE TESTING): HIV: NONREACTIVE

## 2022-01-28 LAB — OB RESULTS CONSOLE RUBELLA ANTIBODY, IGM: Rubella: IMMUNE

## 2022-04-06 ENCOUNTER — Other Ambulatory Visit: Payer: Self-pay

## 2022-04-19 ENCOUNTER — Other Ambulatory Visit: Payer: Self-pay | Admitting: Obstetrics

## 2022-04-19 ENCOUNTER — Telehealth: Payer: Self-pay

## 2022-04-19 DIAGNOSIS — Z3689 Encounter for other specified antenatal screening: Secondary | ICD-10-CM

## 2022-04-21 ENCOUNTER — Ambulatory Visit: Payer: BC Managed Care – PPO | Attending: Obstetrics

## 2022-04-21 ENCOUNTER — Ambulatory Visit: Payer: BC Managed Care – PPO

## 2022-04-21 ENCOUNTER — Other Ambulatory Visit: Payer: Self-pay | Admitting: *Deleted

## 2022-04-21 ENCOUNTER — Ambulatory Visit: Payer: BC Managed Care – PPO | Attending: Obstetrics | Admitting: Maternal & Fetal Medicine

## 2022-04-21 ENCOUNTER — Other Ambulatory Visit: Payer: Self-pay | Admitting: Obstetrics

## 2022-04-21 ENCOUNTER — Ambulatory Visit: Payer: BC Managed Care – PPO | Admitting: *Deleted

## 2022-04-21 VITALS — BP 115/56 | HR 68

## 2022-04-21 DIAGNOSIS — R772 Abnormality of alphafetoprotein: Secondary | ICD-10-CM

## 2022-04-21 DIAGNOSIS — Z364 Encounter for antenatal screening for fetal growth retardation: Secondary | ICD-10-CM

## 2022-04-21 DIAGNOSIS — Z3689 Encounter for other specified antenatal screening: Secondary | ICD-10-CM

## 2022-04-21 DIAGNOSIS — O365990 Maternal care for other known or suspected poor fetal growth, unspecified trimester, not applicable or unspecified: Secondary | ICD-10-CM

## 2022-04-21 NOTE — Progress Notes (Signed)
Valerie Black was seen in the [facility] on 04/21/2022 for an ultrasound due to an abnormal maternal serum AFP screen. After learning the results of her ultrasound and speaking with Dr. Grace Bushy, Ms. Macdonell declined genetic counseling.

## 2022-04-21 NOTE — Progress Notes (Signed)
MFM Brief Note  Valerie Black is a G1P0 who is here at 22 weeks 1 d based on LMP of 08/25/22 at the request of Dr. Noland Fordyce regarding an increased AFP of ~4.47 MoM  She is denies s/sx of preterm labor and or preeclampsia.  She has a low risk NIPS.  Her medical history is uncomplicated.  Single intrauterine pregnancy here for a detailed anatomy due to elevated AFP. Normal anatomy with measurements consistent with dates There is good fetal movement and amniotic fluid volume The UA Dopplers were normal without evidence of AEDF or REDF.  MSAFP screening for neural tube defects is ideally performed between 16 and 18 weeks of pregnancy. A number of factors influence the interpretation of an MSAFP value. The most important factor for efficient MSAFP screening is the accuracy of the gestational age determination. Because there is an exponential rise in MSAFP over the recommended screening period of 15 to 20 weeks, a variation of 2 weeks between the actual gestational age and that used for MSAFP interpretation can be misleading. Maternal weight also effects the MSAFP concentration. The heavier the woman, the lower is the MSAFP value as a result of dilution in the larger blood volume.  Typically, adjusting MSAFP values for maternal weight increase the detection rate for open spina bifida. Maternal ethnicity may also alter the interpretation of an MSAFP level, because black women have a 10% to 15% higher MSAFP than non-blacks.    At today's evaluation, a detailed anatomic ultrasound evaluation was performed, the appropriate gestational age was confirmed and no anomalies were detected. Amniocentesis, in order to measure amniotic fluid AFP (AFAFP) and acetylcholinesterase (AChE) levels, was discussed and offered.  In addition to neural tube defects, other lesions leaking fetal serum into the amniotic fluid can cause elevation of both MSAFP and AFAFP. These include fetal abdominal wall defects such as  omphalocele, gastroschisis or exstrophy of the bladder; weeping skin lesions such as epidermolysis bullosa or aplasia cutis; and some cases of sacrococcygeal teratoma and fetal tract obstruction. At today's ultrasound examination, none of the above anomalies were noted.   AFP is also produced in the fetal gastrointestinal tract and liver, therefore reduced intestinal AFP clearance with regurgitation of intestinal conent has been associated with elevated MSAFP and AFAFP. At today's ultrasound examination, none of the above anomalies were noted.  We finally discussed that in isolated cased of elevate AFP, abnormal placental function is expected be a the cause resulting in possible future FGR or SGA.   At the end of today's visit, your patient declined invasive testing.  Therefore, at this time it is recommended that a surveillance program of serial ultrasonography for growth at 4-6 week intervals and weekly NST or biophysical profile at 32-34 weeks be initiated.   Lastly, she was offered genetic screening and declined at this time.  I discussed today's visit with a diagnosis of FGR with an EFW of 9% primary driven by a shortened femur as the AC was normal. I explained that the etiology includes placental insufficiency, chronic disease, infection, aneuploidy and other genetic syndromes. She has a low risk NIPS but again her AFP is elevated. She has no additional risk factors for chronic disease. At this time I explained the diagnosis, evaluation and management to include on serial fetal growth and antenatal testing to include UA Dopplers.   I have scheduled her to return in 3 weeks to assess the fetal growth.   Thank you for the opportunity to participate in the care  of your patient.   All questions answered.  I spent 30 mintues with > 50% in face to face consultation.  Novella Olive, MD

## 2022-05-11 ENCOUNTER — Ambulatory Visit: Payer: BC Managed Care – PPO | Attending: Maternal & Fetal Medicine

## 2022-05-11 ENCOUNTER — Ambulatory Visit: Payer: BC Managed Care – PPO | Admitting: *Deleted

## 2022-05-11 ENCOUNTER — Other Ambulatory Visit: Payer: Self-pay | Admitting: *Deleted

## 2022-05-11 VITALS — BP 112/58 | HR 58

## 2022-05-11 DIAGNOSIS — O365990 Maternal care for other known or suspected poor fetal growth, unspecified trimester, not applicable or unspecified: Secondary | ICD-10-CM | POA: Insufficient documentation

## 2022-05-11 DIAGNOSIS — Z3A25 25 weeks gestation of pregnancy: Secondary | ICD-10-CM

## 2022-05-11 DIAGNOSIS — R772 Abnormality of alphafetoprotein: Secondary | ICD-10-CM

## 2022-05-11 DIAGNOSIS — Z3689 Encounter for other specified antenatal screening: Secondary | ICD-10-CM

## 2022-05-11 DIAGNOSIS — O280 Abnormal hematological finding on antenatal screening of mother: Secondary | ICD-10-CM | POA: Diagnosis not present

## 2022-06-11 ENCOUNTER — Ambulatory Visit: Payer: BC Managed Care – PPO

## 2022-06-14 LAB — OB RESULTS CONSOLE RPR: RPR: NONREACTIVE

## 2022-07-27 ENCOUNTER — Inpatient Hospital Stay (HOSPITAL_COMMUNITY): Payer: BC Managed Care – PPO | Admitting: Anesthesiology

## 2022-07-27 ENCOUNTER — Inpatient Hospital Stay (HOSPITAL_COMMUNITY)
Admission: AD | Admit: 2022-07-27 | Discharge: 2022-07-29 | DRG: 805 | Disposition: A | Discharge status: Home / Self Care | Payer: BC Managed Care – PPO | Attending: Obstetrics and Gynecology | Admitting: Obstetrics and Gynecology

## 2022-07-27 ENCOUNTER — Encounter (HOSPITAL_COMMUNITY): Payer: Self-pay | Admitting: Obstetrics and Gynecology

## 2022-07-27 ENCOUNTER — Other Ambulatory Visit: Payer: Self-pay

## 2022-07-27 DIAGNOSIS — O9912 Other diseases of the blood and blood-forming organs and certain disorders involving the immune mechanism complicating childbirth: Secondary | ICD-10-CM | POA: Diagnosis present

## 2022-07-27 DIAGNOSIS — Z6791 Unspecified blood type, Rh negative: Secondary | ICD-10-CM

## 2022-07-27 DIAGNOSIS — O411230 Chorioamnionitis, third trimester, not applicable or unspecified: Secondary | ICD-10-CM | POA: Diagnosis present

## 2022-07-27 DIAGNOSIS — D6959 Other secondary thrombocytopenia: Secondary | ICD-10-CM | POA: Diagnosis present

## 2022-07-27 DIAGNOSIS — O134 Gestational [pregnancy-induced] hypertension without significant proteinuria, complicating childbirth: Secondary | ICD-10-CM | POA: Diagnosis present

## 2022-07-27 DIAGNOSIS — O26893 Other specified pregnancy related conditions, third trimester: Secondary | ICD-10-CM | POA: Diagnosis present

## 2022-07-27 DIAGNOSIS — Z3A36 36 weeks gestation of pregnancy: Secondary | ICD-10-CM | POA: Diagnosis not present

## 2022-07-27 DIAGNOSIS — O99119 Other diseases of the blood and blood-forming organs and certain disorders involving the immune mechanism complicating pregnancy, unspecified trimester: Secondary | ICD-10-CM | POA: Diagnosis present

## 2022-07-27 DIAGNOSIS — D696 Thrombocytopenia, unspecified: Secondary | ICD-10-CM | POA: Diagnosis present

## 2022-07-27 DIAGNOSIS — D509 Iron deficiency anemia, unspecified: Secondary | ICD-10-CM | POA: Diagnosis present

## 2022-07-27 DIAGNOSIS — O42913 Preterm premature rupture of membranes, unspecified as to length of time between rupture and onset of labor, third trimester: Secondary | ICD-10-CM | POA: Diagnosis present

## 2022-07-27 DIAGNOSIS — O99019 Anemia complicating pregnancy, unspecified trimester: Secondary | ICD-10-CM | POA: Diagnosis present

## 2022-07-27 DIAGNOSIS — O9902 Anemia complicating childbirth: Secondary | ICD-10-CM | POA: Diagnosis present

## 2022-07-27 DIAGNOSIS — O139 Gestational [pregnancy-induced] hypertension without significant proteinuria, unspecified trimester: Secondary | ICD-10-CM

## 2022-07-27 DIAGNOSIS — O411290 Chorioamnionitis, unspecified trimester, not applicable or unspecified: Secondary | ICD-10-CM | POA: Diagnosis not present

## 2022-07-27 DIAGNOSIS — O42919 Preterm premature rupture of membranes, unspecified as to length of time between rupture and onset of labor, unspecified trimester: Secondary | ICD-10-CM | POA: Diagnosis present

## 2022-07-27 HISTORY — DX: Unspecified asthma, uncomplicated: J45.909

## 2022-07-27 HISTORY — DX: Anemia, unspecified: D64.9

## 2022-07-27 LAB — TYPE AND SCREEN
ABO/RH(D): O NEG
Antibody Screen: POSITIVE

## 2022-07-27 LAB — CBC
HCT: 38 % (ref 36.0–46.0)
HCT: 35.4 % — ABNORMAL LOW (ref 36.0–46.0)
Hemoglobin: 12.6 g/dL (ref 12.0–15.0)
Hemoglobin: 12.2 g/dL (ref 12.0–15.0)
MCH: 30.7 pg (ref 26.0–34.0)
MCH: 31.2 pg (ref 26.0–34.0)
MCHC: 33.2 g/dL (ref 30.0–36.0)
MCHC: 34.5 g/dL (ref 30.0–36.0)
MCV: 92.5 fL (ref 80.0–100.0)
MCV: 90.5 fL (ref 80.0–100.0)
Platelets: 127 10*3/uL — ABNORMAL LOW (ref 150–400)
Platelets: 119 10*3/uL — ABNORMAL LOW (ref 150–400)
RBC: 4.11 MIL/uL (ref 3.87–5.11)
RBC: 3.91 MIL/uL (ref 3.87–5.11)
RDW: 12.9 % (ref 11.5–15.5)
RDW: 12.7 % (ref 11.5–15.5)
WBC: 8.8 10*3/uL (ref 4.0–10.5)
WBC: 16.6 10*3/uL — ABNORMAL HIGH (ref 4.0–10.5)
nRBC: 0 % (ref 0.0–0.2)
nRBC: 0 % (ref 0.0–0.2)

## 2022-07-27 LAB — POCT FERN TEST

## 2022-07-27 LAB — COMPREHENSIVE METABOLIC PANEL
ALT: 21 U/L (ref 0–44)
AST: 30 U/L (ref 15–41)
Albumin: 2.6 g/dL — ABNORMAL LOW (ref 3.5–5.0)
Alkaline Phosphatase: 180 U/L — ABNORMAL HIGH (ref 38–126)
Anion gap: 8 (ref 5–15)
BUN: 8 mg/dL (ref 6–20)
CO2: 23 mmol/L (ref 22–32)
Calcium: 8.9 mg/dL (ref 8.9–10.3)
Chloride: 107 mmol/L (ref 98–111)
Creatinine, Ser: 0.59 mg/dL (ref 0.44–1.00)
GFR, Estimated: >60 mL/min (ref >60)
Glucose, Bld: 79 mg/dL (ref 70–99)
Potassium: 4.2 mmol/L (ref 3.5–5.1)
Sodium: 138 mmol/L (ref 135–145)
Total Bilirubin: 0.2 mg/dL — ABNORMAL LOW (ref 0.3–1.2)
Total Protein: 6.2 g/dL — ABNORMAL LOW (ref 6.5–8.1)

## 2022-07-27 LAB — RPR: RPR Ser Ql: NONREACTIVE

## 2022-07-27 LAB — GROUP B STREP BY PCR: Group B strep by PCR: NEGATIVE

## 2022-07-27 MED ORDER — DIBUCAINE (PERIANAL) 1 % EX OINT: 1.0000 | TOPICAL_OINTMENT | CUTANEOUS | Status: DC | PRN

## 2022-07-27 MED ORDER — AMPICILLIN SODIUM 1 G IJ SOLR: 1.0000 g | Freq: Four times a day (QID) | INTRAMUSCULAR | Status: DC

## 2022-07-27 MED ORDER — PHENYLEPHRINE 80 MCG/ML (10ML) SYRINGE FOR IV PUSH (FOR BLOOD PRESSURE SUPPORT)
80.0000 ug | PREFILLED_SYRINGE | INTRAVENOUS | Status: DC | PRN
Start: 2022-07-27 — End: 2022-07-27

## 2022-07-27 MED ORDER — LACTATED RINGERS IV SOLN: 500.0000 mL | Freq: Once | INTRAVENOUS | Status: DC

## 2022-07-27 MED ORDER — ZOLPIDEM TARTRATE 5 MG PO TABS: 5.0000 mg | ORAL_TABLET | Freq: Every evening | ORAL | Status: DC | PRN

## 2022-07-27 MED ORDER — ACETAMINOPHEN 325 MG PO TABS
650.0000 mg | ORAL_TABLET | ORAL | Status: DC | PRN
Start: 2022-07-27 — End: 2022-07-27

## 2022-07-27 MED ORDER — ONDANSETRON HCL 4 MG/2ML IJ SOLN: 4.0000 mg | INTRAMUSCULAR | Status: DC | PRN

## 2022-07-27 MED ORDER — COCONUT OIL OIL: 1.0000 | TOPICAL_OIL | Status: DC | PRN

## 2022-07-27 MED ORDER — PRENATAL MULTIVITAMIN CH
1.0000 | ORAL_TABLET | Freq: Every day | ORAL | Status: DC
  Administered 2022-07-28 – 2022-07-29 (×2): 1 via ORAL
  Filled 2022-07-27 (×2): qty 1

## 2022-07-27 MED ORDER — ACETAMINOPHEN 500 MG PO TABS
1000.0000 mg | ORAL_TABLET | Freq: Four times a day (QID) | ORAL | Status: DC | PRN
  Administered 2022-07-27: 1000 mg via ORAL
  Filled 2022-07-27: qty 2

## 2022-07-27 MED ORDER — FENTANYL CITRATE (PF) 100 MCG/2ML IJ SOLN
50.0000 ug | Freq: Once | INTRAMUSCULAR | Status: AC
  Administered 2022-07-27: 50 ug via INTRAVENOUS
  Filled 2022-07-27: qty 2

## 2022-07-27 MED ORDER — LACTATED RINGERS IV SOLN: 500.0000 mL | INTRAVENOUS | Status: DC | PRN

## 2022-07-27 MED ORDER — ONDANSETRON HCL 4 MG/2ML IJ SOLN
4.0000 mg | Freq: Four times a day (QID) | INTRAMUSCULAR | Status: DC | PRN
Start: 2022-07-27 — End: 2022-07-27
  Administered 2022-07-27: 4 mg via INTRAVENOUS
  Filled 2022-07-27: qty 2

## 2022-07-27 MED ORDER — ACETAMINOPHEN 325 MG PO TABS: 650.0000 mg | ORAL_TABLET | ORAL | Status: DC | PRN

## 2022-07-27 MED ORDER — WITCH HAZEL-GLYCERIN EX PADS: 1.0000 | MEDICATED_PAD | CUTANEOUS | Status: DC | PRN

## 2022-07-27 MED ORDER — SOD CITRATE-CITRIC ACID 500-334 MG/5ML PO SOLN: 30.0000 mL | ORAL | Status: DC | PRN

## 2022-07-27 MED ORDER — OXYTOCIN-SODIUM CHLORIDE 30-0.9 UT/500ML-% IV SOLN
2.5000 [IU]/h | INTRAVENOUS | Status: DC
  Filled 2022-07-27: qty 500

## 2022-07-27 MED ORDER — OXYTOCIN-SODIUM CHLORIDE 30-0.9 UT/500ML-% IV SOLN
1.0000 m[IU]/min | INTRAVENOUS | Status: DC
  Administered 2022-07-27: 2 m[IU]/min via INTRAVENOUS

## 2022-07-27 MED ORDER — OXYCODONE-ACETAMINOPHEN 5-325 MG PO TABS: 1.0000 | ORAL_TABLET | ORAL | Status: DC | PRN

## 2022-07-27 MED ORDER — BENZOCAINE-MENTHOL 20-0.5 % EX AERO
1.0000 | INHALATION_SPRAY | CUTANEOUS | Status: DC | PRN
  Administered 2022-07-28: 1 via TOPICAL
  Filled 2022-07-27: qty 56

## 2022-07-27 MED ORDER — OXYTOCIN BOLUS FROM INFUSION
333.0000 mL | Freq: Once | INTRAVENOUS | Status: AC
  Administered 2022-07-27: 333 mL via INTRAVENOUS

## 2022-07-27 MED ORDER — SENNOSIDES-DOCUSATE SODIUM 8.6-50 MG PO TABS
2.0000 | ORAL_TABLET | Freq: Every day | ORAL | Status: DC
Start: 2022-07-28 — End: 2022-07-29
  Administered 2022-07-28 – 2022-07-29 (×2): 2 via ORAL
  Filled 2022-07-27 (×2): qty 2

## 2022-07-27 MED ORDER — FENTANYL-BUPIVACAINE-NACL 0.5-0.125-0.9 MG/250ML-% EP SOLN
12.0000 mL/h | EPIDURAL | Status: DC | PRN
  Administered 2022-07-27: 12 mL/h via EPIDURAL
  Filled 2022-07-27: qty 250

## 2022-07-27 MED ORDER — LIDOCAINE-EPINEPHRINE (PF) 1.5 %-1:200000 IJ SOLN
INTRAMUSCULAR | Status: DC | PRN
  Administered 2022-07-27: 5 mL via EPIDURAL

## 2022-07-27 MED ORDER — LIDOCAINE HCL (PF) 1 % IJ SOLN: 30.0000 mL | INTRAMUSCULAR | Status: DC | PRN

## 2022-07-27 MED ORDER — DIPHENHYDRAMINE HCL 50 MG/ML IJ SOLN: 12.5000 mg | INTRAMUSCULAR | Status: DC | PRN

## 2022-07-27 MED ORDER — IBUPROFEN 600 MG PO TABS
600.0000 mg | ORAL_TABLET | Freq: Four times a day (QID) | ORAL | Status: DC
Start: 2022-07-28 — End: 2022-07-29
  Administered 2022-07-28 – 2022-07-29 (×7): 600 mg via ORAL
  Filled 2022-07-27 (×7): qty 1

## 2022-07-27 MED ORDER — SODIUM CHLORIDE 0.9 % IV SOLN
3.0000 g | Freq: Four times a day (QID) | INTRAVENOUS | Status: DC
  Administered 2022-07-27: 3 g via INTRAVENOUS
  Filled 2022-07-27 (×3): qty 8

## 2022-07-27 MED ORDER — LACTATED RINGERS AMNIOINFUSION: INTRAVENOUS | Status: DC

## 2022-07-27 MED ORDER — EPHEDRINE 5 MG/ML INJ
10.0000 mg | INTRAVENOUS | Status: DC | PRN
Start: 2022-07-27 — End: 2022-07-27

## 2022-07-27 MED ORDER — EPHEDRINE 5 MG/ML INJ: 10.0000 mg | INTRAVENOUS | Status: DC | PRN

## 2022-07-27 MED ORDER — LACTATED RINGERS IV SOLN: INTRAVENOUS | Status: DC

## 2022-07-27 MED ORDER — TETANUS-DIPHTH-ACELL PERTUSSIS 5-2.5-18.5 LF-MCG/0.5 IM SUSY: 0.5000 mL | PREFILLED_SYRINGE | Freq: Once | INTRAMUSCULAR | Status: DC

## 2022-07-27 MED ORDER — FLEET ENEMA 7-19 GM/118ML RE ENEM
1.0000 | ENEMA | RECTAL | Status: DC | PRN
Start: 2022-07-27 — End: 2022-07-27

## 2022-07-27 MED ORDER — SIMETHICONE 80 MG PO CHEW: 80.0000 mg | CHEWABLE_TABLET | ORAL | Status: DC | PRN

## 2022-07-27 MED ORDER — LIDOCAINE HCL (PF) 1 % IJ SOLN
INTRAMUSCULAR | Status: DC | PRN
  Administered 2022-07-27: 5 mL via EPIDURAL

## 2022-07-27 MED ORDER — DIPHENHYDRAMINE HCL 25 MG PO CAPS: 25.0000 mg | ORAL_CAPSULE | Freq: Four times a day (QID) | ORAL | Status: DC | PRN

## 2022-07-27 MED ORDER — ONDANSETRON HCL 4 MG PO TABS: 4.0000 mg | ORAL_TABLET | ORAL | Status: DC | PRN

## 2022-07-27 MED ORDER — TERBUTALINE SULFATE 1 MG/ML IJ SOLN
0.2500 mg | Freq: Once | INTRAMUSCULAR | Status: DC | PRN
Start: 2022-07-27 — End: 2022-07-27

## 2022-07-27 MED ORDER — MISOPROSTOL 50MCG HALF TABLET
50.0000 ug | ORAL_TABLET | Freq: Once | ORAL | Status: AC
Start: 2022-07-27 — End: 2022-07-27
  Administered 2022-07-27: 50 ug via BUCCAL
  Filled 2022-07-27: qty 1

## 2022-07-27 MED ORDER — OXYCODONE-ACETAMINOPHEN 5-325 MG PO TABS: 2.0000 | ORAL_TABLET | ORAL | Status: DC | PRN

## 2022-07-27 NOTE — Progress Notes (Signed)
S: Comfortable with epidural. Husband, Samuel Bouche, present and supportive.   O: Vitals:   07/27/22 1235 07/27/22 1301 07/27/22 1331 07/27/22 1349  BP: 101/63 (!) 108/52 126/64   Pulse: 77 74 68   Resp:  17    Temp:    98.5 F (36.9 C)  TempSrc:    Oral  SpO2:      Weight:      Height:       FHT:  FHR: 135 bpm, variability: moderate,  accelerations:  Present,  decelerations:  Absent UC:   irregular, every 1-4 minutes SVE:   Dilation: 4 Effacement (%): 70 Station: -2 Exam by:: AYetta Barre CNM  IUPC placed without difficulty.   A / P: PROM at [redacted]w[redacted]d, clear fluid, gHTN, Pitocin infusing, IUPC placed   Fetal Wellbeing:  Category I PEC: BPs remain mild range, denies PEC symptoms, labs stable, continue to monitor GBS: Negative Pain Control:  Epidural Anticipated MOD:  NSVD  Continue to increase Pitocin until adequate MVUs. Encourage frequent position changes to facilitate fetal rotation and descent.   June Leap, CNM, MSN 07/27/2022, 2:23 PM

## 2022-07-27 NOTE — Anesthesia Procedure Notes (Signed)
Epidural Patient location during procedure: OB Start time: 07/27/2022 11:20 AM End time: 07/27/2022 11:30 AM  Staffing Anesthesiologist: Leonides Grills, MD Performed: anesthesiologist   Preanesthetic Checklist Completed: patient identified, IV checked, site marked, risks and benefits discussed, monitors and equipment checked, pre-op evaluation and timeout performed  Epidural Patient position: sitting Prep: DuraPrep Patient monitoring: heart rate, cardiac monitor, continuous pulse ox and blood pressure Approach: midline Location: L4-L5 Injection technique: LOR air  Needle:  Needle type: Tuohy  Needle gauge: 17 G Needle length: 9 cm Needle insertion depth: 6 cm Catheter type: closed end flexible Catheter size: 19 Gauge Catheter at skin depth: 11 cm Test dose: negative and 1.5% lidocaine with Epi 1:200 K  Assessment Events: blood not aspirated, injection not painful, no injection resistance and negative IV test  Additional Notes Informed consent obtained prior to proceeding including risk of failure, 1% risk of PDPH, risk of minor discomfort and bruising. Discussed alternatives to epidural analgesia and patient desires to proceed.  Timeout performed pre-procedure verifying patient name, procedure, and platelet count.  Patient tolerated procedure well. Reason for block:procedure for pain

## 2022-07-27 NOTE — MAU Note (Signed)
Pt denies HA, visual spots or flashes of light, or epigastric pain. No pitting edema.

## 2022-07-27 NOTE — MAU Note (Signed)
Pt grossly ruptured with clear amniotic fluid. Fern collected for confirmation.

## 2022-07-27 NOTE — Progress Notes (Signed)
S: Comfortable. Husband, Samuel Bouche, and mother, present and supportive. Discussed the R/B/A of Foley balloon placement for cervical ripening and patient consents to procedure.   O: Vitals:   07/27/22 0645 07/27/22 0731 07/27/22 0850 07/27/22 0931  BP: (!) 149/91 (!) 135/90 (!) 149/96   Pulse: 90 82 74   Resp: 18   18  Temp: 98.4 F (36.9 C)   98.5 F (36.9 C)  TempSrc:    Oral  SpO2: 100%     Weight:      Height:       FHT:  FHR: 135 bpm, variability: moderate,  accelerations:  Present,  decelerations:  Absent UC:   irregular SVE:   Dilation: 1 Effacement (%): 70 Station: -2 Exam by:: a Shermaine Rivet cnm  Foley balloon placed without difficulty. 60cc of fluid instilled in balloon and taped to leg for traction. Patient tolerated procedure well.   A / P: PROM at [redacted]w[redacted]d, clear fluid, not in labor, s/p 1 dose of buccal Cytotec, Foley balloon placed for cervical ripening, gHTN  Fetal Wellbeing:  Category I PEC: BPs remain mild range, denies PEC symptoms, labs stable, continue to monitor GBS: Negative Pain Control:  IV pain meds Anticipated MOD:  NSVD  Continue buccal Cytotec q 4 hours until Foley balloon is expelled. Epidural upon patient request. Pitocin prn.   June Leap, CNM, MSN 07/27/2022, 10:01 AM

## 2022-07-27 NOTE — Consult Note (Signed)
Pharmacy Antibiotic Note  Valerie Black is a 26 y.o. female admitted for PROM, being initiated on antibiotics for presumed chorioamnionitis. Pharmacy has been consulted for Unasyn dosing.  Plan: Unasyn 3 g IV q6h  Height: 5\' 9"  (175.3 cm) Weight: 76.9 kg (169 lb 9.6 oz) IBW/kg (Calculated) : 66.2  Temp (24hrs), Avg:98.8 F (37.1 C), Min:98 F (36.7 C), Max:102.3 F (39.1 C)  Recent Labs  Lab 07/27/22 0617  WBC 8.8  CREATININE 0.59    Estimated Creatinine Clearance: 113.3 mL/min (by C-G formula based on SCr of 0.59 mg/dL).    No Known Allergies  Thank you for allowing pharmacy to be a part of this patient's care.  07/29/22 Valerie Black 07/27/2022 7:44 PM

## 2022-07-27 NOTE — MAU Note (Signed)
External monitors off for transfer to LD room 217- Pt to BR prior to transport

## 2022-07-27 NOTE — H&P (Signed)
OB ADMISSION/ HISTORY & PHYSICAL:  Admission Date: 07/27/2022  4:12 AM  Admit Diagnosis: Normal labor [O80, Z37.9]    Valerie Black is a 26 y.o. female G1P0 at [redacted]w[redacted]d presenting for rupture of membranes. Patient reports a large gush of clear fluid at 0345. + ferning on admission. Denies contractions or leaking of fluid. Endorses + fetal movement. Husband, Samuel Bouche, present and supportive. Eagerly anticipating a baby boy with an undecided name.   Prenatal History: G1P0   EDC: 08/24/2022, by Last Menstrual Period  [facility] at [facility] since 10 weeks  Primary: Dr. Ernestina Penna  Prenatal course complicated by: PROM at 36 weeks, clear fluid Anemia, on PO iron Gestational thrombocytopenia, last platelets 113 Rh negative, Rhogam at 28 weeks Elevated AFP, s/p MFM consult, declined amnio, normal spine views on U/S  Prenatal Labs: ABO, Rh:   O NEG Antibody: POS (08/29 7673) Rubella: Immune (03/02 0000)  RPR: Nonreactive (07/17 0000)  HBsAg: Negative (03/02 0000)  HIV: Non-reactive (03/02 0000)  GBS: NEGATIVE/-- (08/29 0617)  1 hr Glucola : 97 Genetic Screening: [employer] low risk XY, elevated AFP-1, declined amnio Ultrasound: normal XY anatomy including spine views, last growth 16% at 33 weeks    Maternal Diabetes: No Genetic Screening: Abnormal:  Results: Elevated AFP Maternal Ultrasounds/Referrals: Normal Fetal Ultrasounds or other Referrals:  Referred to Materal Fetal Medicine  Maternal Substance Abuse:  No Significant Maternal Medications:  None Significant Maternal Lab Results:  Group B Strep negative and Rh negative Other Comments:  None  Medical / Surgical History : Past medical history:  Past Medical History:  Diagnosis Date   Anemia    Asthma    exercise induced-on no medications   Frequent headaches    Migraines     Past surgical history:  Past Surgical History:  Procedure Laterality Date   ADENOIDECTOMY     COSMETIC SURGERY Bilateral 01/27/2017   breast    DERMOID CYST  EXCISION     age 26 years (neck)   TONSILLECTOMY     TYMPANOSTOMY TUBE PLACEMENT     26 years of age   17 TOOTH EXTRACTION Bilateral     Family History:  Family History  Problem Relation Age of Onset   Hypertension Father    Cancer Maternal Grandmother    Cancer Paternal Grandmother    Heart attack Paternal Grandfather    Hyperlipidemia Paternal Grandfather    Hypertension Paternal Grandfather    Stroke Paternal Grandfather     Social History:  reports that she has never smoked. She has never used smokeless tobacco. She reports that she does not drink alcohol and does not use drugs.  Allergies: Patient has no known allergies.   Current Medications at time of admission:  Medications Prior to Admission  Medication Sig Dispense Refill Last Dose   ferrous sulfate 324 MG TBEC Take 324 mg by mouth every other day.   Past Week   prenatal vitamin w/FE, FA (PRENATAL 1 + 1) 27-1 MG TABS tablet Take 1 tablet by mouth daily at 12 noon.   07/26/2022    Review of Systems: Review of Systems  All other systems reviewed and are negative.  Physical Exam: Vital signs and nursing notes reviewed.  Patient Vitals for the past 24 hrs:  BP Temp Temp src Pulse Resp SpO2 Height Weight  07/27/22 0731 (!) 135/90 -- -- 82 -- -- -- --  07/27/22 0645 (!) 149/91 98.4 F (36.9 C) -- 90 18 100 % -- --  07/27/22 0601 (!) 146/92 -- -- 86 -- -- -- --  07/27/22 0546 (!) 135/92 -- -- 88 -- -- -- --  07/27/22 0531 (!) 153/84 -- -- 76 -- -- -- --  07/27/22 0525 -- -- -- -- -- 100 % -- --  07/27/22 0520 (!) 159/98 -- -- 83 -- 100 % -- --  07/27/22 0515 -- -- -- -- -- 100 % -- --  07/27/22 0510 -- -- -- -- -- 100 % -- --  07/27/22 0455 -- -- -- -- -- 100 % -- --  07/27/22 0450 -- -- -- -- -- 100 % -- --  07/27/22 0447 (!) 151/91 -- -- 84 -- -- -- --  07/27/22 0425 (!) 144/91 98 F (36.7 C) Oral 90 20 -- 5\' 9"  (1.753 m) 76.9 kg    General: AAO x 3, NAD Heart: RRR Lungs:CTAB Abdomen:  Gravid, NT Extremities: no edema SVE: Dilation:  1 Effacement (%): 50 Cervical Position: Middle Station: -2 Presentation: Vertex Exam by:: [email or web address] RN   FHR: 140BPM, moderate variability, + accels, no decels TOCO: Contractions irregular  Labs:   Recent Labs    07/27/22 0617  WBC 8.8  HGB 12.6  HCT 38.0  PLT 127*   Assessment/Plan: 26 y.o. G1P0 at [redacted]w[redacted]d, PROM of clear fluid at 0345 Gestational hypertension, normal PEC labs, denies PEC symptoms Gestational thrombocytopenia, platelets on admission 127 Elevated AFP, declined amnio, s/p MFM consult, normal spine views  Fetal wellbeing - FHT category 1 EFW SGA 5-6lbs  Labor: PROM of clear fluid, plan buccal Cytotec [redacted]w[redacted]d q 4 hours, place Foley balloon for cervical ripening, Pitocin prn  GBS negative (PCR on admission negative) Rubella immune Rh negative, Rhogam at 28 weeks  Mild range BP on admission, denies PEC symptoms, PEC labs WNL, meets criteria for gestational hypertension, Labetalol for severe range BP, consider Procardia postpartum  Pain control: desires epidural in active labor Analgesia/anesthesia PRN  Anticipated MOD: NSVB  Plans to breastfeed. POC discussed with patient and support team, all questions answered.  Dr. notified of admission/plan of care.  Conni Elliot CNM, MSN 07/27/2022, 8:25 AM

## 2022-07-27 NOTE — Progress Notes (Signed)
S: Feeling rectal pressure. Husband, Samuel Bouche, present and supportive.   O: Vitals:   07/27/22 1842 07/27/22 1847 07/27/22 1901 07/27/22 1920  BP: (!) 144/65  (!) 140/64   Pulse: 92  89   Resp:  18 16   Temp:  98.5 F (36.9 C)  (!) 102.3 F (39.1 C)  TempSrc:  Oral  Axillary  SpO2:      Weight:      Height:       FHT:  FHR: 150 bpm, variability: moderate,  accelerations:  Present,  decelerations:  Present variables decels with contractions UC:   regular, every 1-3 minutes, MVUs 220 SVE:   Dilation: 8 Effacement (%): 80 Station: -1 Exam by: a Zyria Fiscus cnm  A / P: PROM at [redacted]w[redacted]d, clear fluid, gHTN, Pitocin infusing, MVUs 220, recurrent variables, presumed chorioamnionitis  Fetal Wellbeing:  Category II I/D: 102.89F axillary temp, presumed chorioamnionitis, will start Unasyn q 6 hours, PO Tylenol 1000mg  q 6 hours Pain Control:  Epidural Anticipated MOD:   Working towards NSVB  Will start amnioinfusion for recurrent variables. Continue frequent position changes to facilitate fetal rotation and descent.   Dr. updated on patient status and plan of care.   Billy Coast, CNM, MSN 07/27/2022, 7:25 PM

## 2022-07-27 NOTE — Lactation Note (Signed)
This note was copied from a baby's chart. Lactation Consultation Note  Patient Name: Valerie Black [id number] Date: 07/27/2022 Reason for consult: L&D Initial assessment;Primapara;1st time breastfeeding;Late-preterm 34-36.6wks;Breastfeeding assistance Age:25 hours  P1, Late-preterm, Female Infant   LC entered the room and baby was STS with the birth parent.  LC attempted to latch baby to the left breast in the cross-cradle position.  Baby latched for 3 sucks and started fussing.  LC placed baby STS with birth parent.  Parents are aware that they will be seen on MBU.   Maternal Data Does the patient have breastfeeding experience prior to this delivery?: No  Feeding Mother's Current Feeding Choice: Breast Milk  LATCH Score Latch: Too sleepy or reluctant, no latch achieved, no sucking elicited.  Audible Swallowing: None  Type of Nipple: Everted at rest and after stimulation  Comfort (Breast/Nipple): Soft / non-tender  Hold (Positioning): No assistance needed to correctly position infant at breast.  LATCH Score: 6   Lactation Tools Discussed/Used    Interventions Interventions: Assisted with latch;Adjust position;Support pillows  Discharge    Consult Status Consult Status: Follow-up from L&D Date: 07/27/22 Follow-up type: In-patient    Delene Loll 07/27/2022, 9:49 PM

## 2022-07-27 NOTE — Anesthesia Preprocedure Evaluation (Signed)
Anesthesia Evaluation  Patient identified by MRN, date of birth, ID band Patient awake    Reviewed: Allergy & Precautions, H&P , NPO status , Patient's Chart, lab work & pertinent test results  History of Anesthesia Complications Negative for: history of anesthetic complications  Airway Mallampati: II  TM Distance: >3 FB Neck ROM: full    Dental no notable dental hx. (+) Teeth Intact   Pulmonary asthma ,    Pulmonary exam normal breath sounds clear to auscultation       Cardiovascular negative cardio ROS Normal cardiovascular exam Rhythm:regular Rate:Normal     Neuro/Psych  Headaches, negative psych ROS   GI/Hepatic negative GI ROS, Neg liver ROS,   Endo/Other  negative endocrine ROS  Renal/GU negative Renal ROS  negative genitourinary   Musculoskeletal   Abdominal   Peds  Hematology negative hematology ROS (+)   Anesthesia Other Findings   Reproductive/Obstetrics (+) Pregnancy                             Anesthesia Physical Anesthesia Plan  ASA: 2  Anesthesia Plan: Epidural   Post-op Pain Management:    Induction:   PONV Risk Score and Plan:   Airway Management Planned:   Additional Equipment:   Intra-op Plan:   Post-operative Plan:   Informed Consent: I have reviewed the patients History and Physical, chart, labs and discussed the procedure including the risks, benefits and alternatives for the proposed anesthesia with the patient or authorized representative who has indicated his/her understanding and acceptance.       Plan Discussed with:   Anesthesia Plan Comments:         Anesthesia Quick Evaluation

## 2022-07-27 NOTE — MAU Note (Signed)
Pt says  at 0340- awoke- thought had to void- going back to bed - had a gush- has continued - clear  [facility]- Dr  Ernestina Penna No VE Has an appointment today- to discuss low platelets  Feels some UC's  Denies HSV GBS- unsure

## 2022-07-28 DIAGNOSIS — O139 Gestational [pregnancy-induced] hypertension without significant proteinuria, unspecified trimester: Secondary | ICD-10-CM

## 2022-07-28 LAB — CBC
HCT: 31.6 % — ABNORMAL LOW (ref 36.0–46.0)
Hemoglobin: 10.8 g/dL — ABNORMAL LOW (ref 12.0–15.0)
MCH: 30.8 pg (ref 26.0–34.0)
MCHC: 34.2 g/dL (ref 30.0–36.0)
MCV: 90 fL (ref 80.0–100.0)
Platelets: 107 10*3/uL — ABNORMAL LOW (ref 150–400)
RBC: 3.51 MIL/uL — ABNORMAL LOW (ref 3.87–5.11)
RDW: 12.8 % (ref 11.5–15.5)
WBC: 13.9 10*3/uL — ABNORMAL HIGH (ref 4.0–10.5)
nRBC: 0 % (ref 0.0–0.2)

## 2022-07-28 MED ORDER — RHO D IMMUNE GLOBULIN 1500 UNIT/2ML IJ SOSY
300.0000 ug | PREFILLED_SYRINGE | Freq: Once | INTRAMUSCULAR | Status: AC
  Administered 2022-07-28: 300 ug via INTRAVENOUS
  Filled 2022-07-28: qty 2

## 2022-07-28 NOTE — Lactation Note (Signed)
This note was copied from a baby's chart. Lactation Consultation Note  Patient Name: Valerie Black NATFT'D Date: 07/28/2022 Reason for consult: Follow-up assessment;Primapara;Exclusive pumping and bottle feeding;Late-preterm 34-36.6wks;Infant < 6lbs Age:25 hours  P1: Late preterm infant at 36+0 with a CGA of 36+1 weeks weighing < 5 lbs Feeding preference: Breast milk (Birth parent plans to pump and bottle feed only)                                   Formula supplementation until milk comes to volume Weight loss: 1%  Baby "[name]" was swaddled and asleep in support person's arms when I arrived.  Reviewed current feeding plan and also reviewed the late preterm infant policy.  Encouraged lots of STS, breast massage and hand expression.    Parents will awaken and feed "[name]" at least every three hours or sooner if he shows cues or acts hungry.  They are not having any difficulty nipple feeding him.  He is currently consuming appropriate volumes.  Discussed volume guidelines, however, encouraged to feed him more if he desires.  Suggested parents call for help if needed.  Birth parent does not currently have an electric pump.  She has private insurance; suggested parents call insurance company today to determine eligibility and to get their pump shipped.  Support person works at [employer] and feels certain he can get the pump delivered immediately once birth parent makes her decision.  Briefly mentioned the [facility] pump option.  Birth parent would like to review pumps prior to making her decision.  Asked her to call if she decides the [place] pump option would work out well for her.  Birth parent also stated that she has some discomfort with pumping.  Due to visitor present, birth parent will call for a flange check with the next pumping session.  Reminded her that adequate sizing and suction is vital to helping ensure a good milk supply.  Parents very receptive to all teaching.    Maternal Data Has patient  been taught Hand Expression?: Yes Does the patient have breastfeeding experience prior to this delivery?: No  Feeding Mother's Current Feeding Choice: Breast Milk and Formula (Supplementation until birth parent's milk comes to volume)  LATCH Score                    Lactation Tools Discussed/Used Tools: Shells;Pump;Flanges Flange Size: 24 Breast pump type: Double-Electric Breast Pump;Manual Pump Education: Setup, frequency, and cleaning (Asked birth parent to call so LC can assess flange size with next feeding) Reason for Pumping: Breast stimulation for supplementation Pumping frequency: Every three hours Pumped volume: 1 mL  Interventions    Discharge Pump: Advised to call insurance company (Parents will follow through today with contacting insurance company) WIC Program: No  Consult Status Consult Status: Follow-up Date: 07/29/22 Follow-up type: In-patient    Valerie Black 07/28/2022, 1:21 PM

## 2022-07-28 NOTE — Anesthesia Postprocedure Evaluation (Signed)
Anesthesia Post Note  Patient: Valerie Black  Procedure(s) Performed: AN AD HOC LABOR EPIDURAL     Patient location during evaluation: Mother Baby Anesthesia Type: Epidural Level of consciousness: awake and alert Pain management: pain level controlled Vital Signs Assessment: post-procedure vital signs reviewed and stable Respiratory status: spontaneous breathing, nonlabored ventilation and respiratory function stable Cardiovascular status: stable Postop Assessment: no headache, no backache and epidural receding Anesthetic complications: no   No notable events documented.  Last Vitals:  Vitals:   07/28/22 0010 07/28/22 0427  BP: 138/86 118/77  Pulse: 88 61  Resp: 18 18  Temp: 36.9 C 36.8 C  SpO2: 99% 97%    Last Pain:  Vitals:   07/28/22 0427  TempSrc: Oral  PainSc:    Pain Goal:                   Kalib Bhagat

## 2022-07-28 NOTE — Lactation Note (Signed)
This note was copied from a baby's chart. Lactation Consultation Note  Patient Name: Valerie Black Date: 07/28/2022 Reason for consult: Initial assessment;Primapara;Late-preterm 34-36.6wks;Infant < 6lbs Age:25 hours Baby has low glucose. RN in room feeding baby formula. Baby took it well. Discussed w/mom pumping, mom in agreement. Mom pumped and collected colostrum. Milk storage reviewed. Shells given for mom to wear in am w/bra. LC massaged breast before pumping, reverse pressure and hand expression caused colostrum to express. LC expressed 5 ml colostrum. Noted breast softening. Praised mom. Mom has had breast Augmentation.  LPI information sheet given and reviewed. LPI behavior, STS, I&O, feeding habits, supply and demand reviewed. Encouraged mom to call for next feeding. Discussed w/mom that since baby's glucose is so low we can't put him to the breast d/t burns to much calories. Encouraged STS.  LC feels mom will need a NS in order to obtain a deep latch and transfer milk.   Maternal Data Has patient been taught Hand Expression?: Yes Does the patient have breastfeeding experience prior to this delivery?: No  Feeding Mother's Current Feeding Choice: Breast Milk and Formula Nipple Type: Nfant Slow Flow (purple)  LATCH Score Latch: Too sleepy or reluctant, no latch achieved, no sucking elicited.  Audible Swallowing: None  Type of Nipple: Flat  Comfort (Breast/Nipple): Filling, red/small blisters or bruises, mild/mod discomfort (breast full/edema)  Hold (Positioning): No assistance needed to correctly position infant at breast.  LATCH Score: 6   Lactation Tools Discussed/Used Tools: Pump;Shells Breast pump type: Double-Electric Breast Pump Pump Education: Setup, frequency, and cleaning;Milk Storage Reason for Pumping: LPI Pumping frequency: q3hr  Interventions Interventions: Breast feeding basics reviewed;Breast massage;Hand express;Pre-pump if  needed;Reverse pressure;Breast compression;Expressed milk;Shells;DEBP;LC Services brochure;LPT handout/interventions  Discharge    Consult Status Consult Status: Follow-up Date: 07/28/22 Follow-up type: In-patient    Tamara Monteith, Diamond Nickel 07/28/2022, 1:12 AM

## 2022-07-28 NOTE — Progress Notes (Addendum)
       PPD # 1 S/P SVD  Live born female  Birth Weight: 4 lb 12.5 oz (2170 g) APGAR: 8, 9  Newborn Delivery   Birth date/time: 07/27/2022 20:58:00 Delivery type: Vaginal, Spontaneous     Baby name: [name] Delivering provider: June Leap  Episiotomy:None   Lacerations:None   circumcision Desires  Feeding: bottle for fetal indication, pt pumping and giving colostrum as well.   Pain control at delivery: Epidural   S:  Reports feeling well, denies chills, malaise.               Tolerating po/ No nausea or vomiting             Bleeding is light             Pain controlled with ibuprofen (OTC)             Up ad lib / ambulatory / voiding without difficulties   O:  A & O x 3, in no apparent distress              VS:  Vitals:   07/27/22 2145 07/27/22 2309 07/28/22 0010 07/28/22 0427  BP: (!) 133/91 (!) 134/96 138/86 118/77  Pulse: 80 89 88 61  Resp:  18 18 18   Temp:  99 F (37.2 C) 98.4 F (36.9 C) 98.2 F (36.8 C)  TempSrc:  Oral Oral Oral  SpO2:  100% 99% 97%  Weight:      Height:        LABS:  Recent Labs    07/27/22 2249 07/28/22 0431  WBC 16.6* 13.9*  HGB 12.2 10.8*  HCT 35.4* 31.6*  PLT 119* 107*    Blood type: --/--/O NEG (08/29 01-11-2004)  Rubella: Immune (03/02 0000)   I&O: I/O last 3 completed shifts: In: -  Out: 1226 [Urine:1100; Blood:126]          No intake/output data recorded.  Vaccines: TDaP          up to date   Gen: AAO x 3, NAD  Abdomen: soft, non-tender, non-distended             Fundus: firm, non-tender, U-1  Perineum: intact, mild swelling  Lochia: small, no odor or purulence  Extremities: mild non-pitting edema, no calf pain or tenderness    A/P: PPD # 1 25 y.o., G1P0101   Principal Problem:   Postpartum care following vaginal delivery 8/29 Active Problems:   SVD (spontaneous vaginal delivery)   RhD negative - Newborn Rh positive   Gestational thrombocytopenia - bleeding stable   Chorioamnionitis - IV ampicillin-Sulbactam 3g  once in labor, no evidence of further infection, GBS negative. Continue to monitor vitals.    Gestational hypertension - new onset at admission, no PEC s/s. Continue to monitor BP for need of therapy. 1 week PP f/u.     Maternal Rh negative Newborn Rh positive - Rhogam given   Routine post partum orders  Anticipate discharge tomorrow    9/29, BSN, RN, [facility]: Lambert Rhonda W 07/28/2022, 11:58 AM

## 2022-07-28 NOTE — Lactation Note (Signed)
This note was copied from a baby's chart. Lactation Consultation Note  Patient Name: Valerie Black [id number] Date: 07/28/2022   Age:25 hours Per Birth Parent,  breast flange is good fit , just had setting turned up high when she pumped. Doesn't have any pumping concerns at this time and had decreased setting to comfort. LC discussed this with Birth Parent on Vocera while RN was in room.  Maternal Data    Feeding Nipple Type: Extra Slow Flow  LATCH Score                    Lactation Tools Discussed/Used    Interventions    Discharge    Consult Status      Danelle Earthly 07/28/2022, 11:56 PM

## 2022-07-29 LAB — RH IG WORKUP (INCLUDES ABO/RH)
Fetal Screen: NEGATIVE
Gestational Age(Wks): 36
Unit division: 0

## 2022-07-29 LAB — SURGICAL PATHOLOGY

## 2022-07-29 MED ORDER — COCONUT OIL OIL: 1.0000 | TOPICAL_OIL | 0 refills | Status: AC | PRN

## 2022-07-29 MED ORDER — ACETAMINOPHEN 325 MG PO TABS: 650.0000 mg | ORAL_TABLET | ORAL | Status: AC | PRN

## 2022-07-29 MED ORDER — IBUPROFEN 600 MG PO TABS: 600.0000 mg | ORAL_TABLET | Freq: Four times a day (QID) | ORAL | 0 refills | Status: DC

## 2022-07-29 MED ORDER — BENZOCAINE-MENTHOL 20-0.5 % EX AERO: 1.0000 | INHALATION_SPRAY | CUTANEOUS | Status: DC | PRN

## 2022-07-29 NOTE — Discharge Instructions (Signed)
Lactation outpatient support - home visit ° ° °Jessica Bowers, IBCLC (lactation consultant)  & Birth Doula ° °Phone (text or call): [phone] °Email: [email or web address] °[email or web address] ° ° °Linda Coppola °RN, MHA, IBCLC °at [facility]: Lactation Consultant ° °[email or web address]://[email or web address]/ °Mail: [email or web address] °Tel: [phone] ° ° °Additional breastfeeding resources: ° °[facility] °https://ibconline.ca/information-sheets/ ° °[employer] [place] ° °www.lllofnc.org ° ° °Other Resources: ° °Chiropractic specialist  ° °Dr. Leanna Hastings °[email or web address] ° ° °Craniosacral therapy for baby ° °Erin Balkind  °[email or web address]://[email or web address] ° °

## 2022-07-29 NOTE — Discharge Summary (Signed)
OB Discharge Summary  Patient Name: Valerie Black DOB: Oct 02, 1996 MRN: [id number]  Date of admission: 07/27/2022 Delivering provider: Dorisann Frames K   Admitting diagnosis: Normal labor [O80, Z37.9] Intrauterine pregnancy: [redacted]w[redacted]d     Secondary diagnosis: Patient Active Problem List   Diagnosis Date Noted   Gestational hypertension 07/28/2022   SVD (spontaneous vaginal delivery) 07/27/2022   Postpartum care following vaginal delivery 8/29 07/27/2022   RhD negative - Newborn Rh positive 07/27/2022   Gestational thrombocytopenia  07/27/2022   Chorioamnionitis 07/27/2022   Additional problems:none   Date of discharge: 07/29/2022   Discharge diagnosis: Principal Problem:   Postpartum care following vaginal delivery 8/29 Active Problems:   SVD (spontaneous vaginal delivery)   RhD negative - Newborn Rh positive   Gestational thrombocytopenia    Chorioamnionitis   Gestational hypertension                                                              Post partum procedures:rhogam  Augmentation: Pitocin, Cytotec, and IP Foley Pain control: Epidural  Laceration:None  Episiotomy:None  Complications: None  Hospital course:  Onset of Labor With Vaginal Delivery      26 y.o. yo G1P0101 at [redacted]w[redacted]d was admitted in Latent Labor on 07/27/2022. Patient had an uncomplicated labor course as follows:  Membrane Rupture Time/Date: 3:45 AM ,07/27/2022   Delivery Method:Vaginal, Spontaneous  Episiotomy: None  Lacerations:  None  Patient had an uncomplicated postpartum course.  She is ambulating, tolerating a regular diet, passing flatus, and urinating well. Patient is discharged home in stable condition on 07/29/22.  Newborn Data: Birth date:07/27/2022  Birth time:8:58 PM  Gender:Female  Living status:Living  Apgars:8 ,9  Weight:2170 g   Physical exam  Vitals:   07/28/22 0830 07/28/22 1300 07/28/22 1941 07/29/22 0507  BP:  127/83 123/77 126/71  Pulse: 63 62 64 63  Resp: 16 16 16 18    Temp: 97.6 F (36.4 C) 98.2 F (36.8 C) (!) 97.5 F (36.4 C) (!) 97.5 F (36.4 C)  TempSrc: Oral Oral Oral Oral  SpO2: 98% 99% 98% 98%  Weight:      Height:       General: alert, cooperative, and no distress Lochia: appropriate Uterine Fundus: firm Incision: Healing well with no significant drainage Perineum: repair intact, no edema DVT Evaluation: No cords or calf tenderness. No significant calf/ankle edema. Labs: Lab Results  Component Value Date   WBC 13.9 (H) 07/28/2022   HGB 10.8 (L) 07/28/2022   HCT 31.6 (L) 07/28/2022   MCV 90.0 07/28/2022   PLT 107 (L) 07/28/2022      Latest Ref Rng & Units 07/27/2022    6:17 AM  CMP  Glucose 70 - 99 mg/dL 79   BUN 6 - 20 mg/dL 8   Creatinine 07/29/2022 - 2.70 mg/dL 6.23   Sodium 7.62 - 831 mmol/L 138   Potassium 3.5 - 5.1 mmol/L 4.2   Chloride 98 - 111 mmol/L 107   CO2 22 - 32 mmol/L 23   Calcium 8.9 - 10.3 mg/dL 8.9   Total Protein 6.5 - 8.1 g/dL 6.2   Total Bilirubin 0.3 - 1.2 mg/dL 0.2   Alkaline Phos 38 - 126 U/L 180   AST 15 - 41 U/L 30   ALT 0 -  44 U/L 21       07/28/2022    4:00 PM  Edinburgh Postnatal Depression Scale Screening Tool  I have been able to laugh and see the funny side of things. 0  I have looked forward with enjoyment to things. 0  I have blamed myself unnecessarily when things went wrong. 1  I have been anxious or worried for no good reason. 2  I have felt scared or panicky for no good reason. 0  Things have been getting on top of me. 0  I have been so unhappy that I have had difficulty sleeping. 0  I have felt sad or miserable. 0  I have been so unhappy that I have been crying. 0  The thought of harming myself has occurred to me. 0  Edinburgh Postnatal Depression Scale Total 3   Vaccines: TDaP          UTD   Discharge instruction:  per After Visit Summary,  [facility] OB booklet and  "Understanding Mother & Baby Care" hospital booklet  After Visit Meds:  Allergies as of 07/29/2022   No Known  Allergies      Medication List     STOP taking these medications    ferrous sulfate 324 MG Tbec       TAKE these medications    acetaminophen 325 MG tablet Commonly known as: Tylenol Take 2 tablets (650 mg total) by mouth every 4 (four) hours as needed (for pain scale < 4).   benzocaine-Menthol 20-0.5 % Aero Commonly known as: DERMOPLAST Apply 1 Application topically as needed for irritation (perineal discomfort).   coconut oil Oil Apply 1 Application topically as needed.   ibuprofen 600 MG tablet Commonly known as: ADVIL Take 1 tablet (600 mg total) by mouth every 6 (six) hours.   prenatal vitamin w/FE, FA 27-1 MG Tabs tablet Take 1 tablet by mouth daily at 12 noon.               Discharge Care Instructions  (From admission, onward)           Start     Ordered   07/29/22 0000  Discharge wound care:       Comments: Sitz baths 2 times /day with warm water x 1 week. May add herbals: 1 ounce dried comfrey leaf* 1 ounce calendula flowers 1 ounce lavender flowers  Supplies can be found online at [employer] sources at [employer], [employer]  1/2 ounce dried uva ursi leaves 1/2 ounce witch hazel blossoms (if you can find them) 1/2 ounce dried sage leaf 1/2 cup sea salt Directions: Bring 2 quarts of water to a boil. Turn off heat, and place 1 ounce (approximately 1 large handful) of the above mixed herbs (not the salt) into the pot. Steep, covered, for 30 minutes.  Strain the liquid well with a fine mesh strainer, and discard the herb material. Add 2 quarts of liquid to the tub, along with the 1/2 cup of salt. This medicinal liquid can also be made into compresses and peri-rinses.   07/29/22 1004            Diet: routine diet  Activity: Advance as tolerated. Pelvic rest for 6 weeks.   Postpartum contraception: TBA in office  Newborn Data: Live born female  Birth Weight: 4 lb 12.5 oz (2170 g) APGAR: 8, 9  Newborn Delivery    Birth date/time: 07/27/2022 20:58:00 Delivery type: Vaginal, Spontaneous      named  [name] Baby Feeding: Bottle and Breast Disposition:rooming in Circumcision: deferred to urologist   Delivery Report:  Review the Delivery Report for details.    Follow up:  Follow-up Information     Noland Fordyce, MD. Schedule an appointment as soon as possible for a visit in 6 week(s).   Specialty: Obstetrics and Gynecology Why: For Postpartum follow-up Contact information: [address] [place] [address] [phone]                   Signed: Neta Mends, CNM, MSN 07/29/2022, 10:05 AM

## 2022-07-29 NOTE — Lactation Note (Signed)
This note was copied from a baby's chart. Lactation Consultation Note  Patient Name: Valerie Black JIRCV'E Date: 07/29/2022 Reason for consult: Initial assessment;Follow-up assessment;Primapara;Late-preterm 34-36.6wks Age:25 hours  Parent very tired.  LC offered to come back.   Parent states she was a bit discouraged about volume amt. Collected when pumping and she has been very tired; too tired to pump.  LC discussed pumping amount expectations the first few days, consistency with pumping, and also getting a stork pump for DC.   Encouraged parent to get some rest. When she is feeling able to pump, encouraged every 2-3 hours or 8 x in 24 hours.  Strongly recommended OP LC appt. With sharon Hice due to LPT, birth wt..  Message sent.    Maternal Data    Feeding Mother's Current Feeding Choice: Breast Milk and Formula Nipple Type: Extra Slow Flow  LATCH Score                    Lactation Tools Discussed/Used Breast pump type: Double-Electric Breast Pump Pump Education: Setup, frequency, and cleaning Reason for Pumping: stimulate milk supply  Interventions Interventions: Education  Discharge Discharge Education: Outpatient recommendation;Outpatient Epic message sent Pump: DEBP;Stork Pump (pt. requests stork pump)  Consult Status Consult Status: Follow-up Date: 07/30/22 Follow-up type: In-patient    Maryruth Hancock [facility] 07/29/2022, 11:43 AM

## 2022-07-30 ENCOUNTER — Ambulatory Visit: Payer: Self-pay

## 2022-07-30 NOTE — Lactation Note (Signed)
This note was copied from a baby's chart. Lactation Consultation Note  Patient Name: Valerie Black IRCVE'L Date: 07/30/2022 Reason for consult: Follow-up assessment;1st time breastfeeding;Primapara;Late-preterm 34-36.6wks;Infant < 6lbs Age:25 hours  LC visited with P1 Mom of LPTI.  Baby at a 4% weight loss with good output.  Mom has been consistently double pumping and supplementing baby.  Baby recently took 26 ml of 22 cal formula.  Mom had 3 ml EBM that she collected at previous pumping.  Encouraged Mom to feed baby her EBM before formula.  Mom informed of option to fortify her EBM once her volume increases.  Mom will ask her baby's Ped.  Engorgement prevention and treatment reviewed. Mom aware of volume parameters from LPTI guidelines.  Encouraged OP lactation f/u but Mom declined referral.  Mom prefers to pump and bottle feed.  Mom encouraged to call prn for concerns. STORK pump delivered.   Lactation Tools Discussed/Used Tools: Pump;Flanges;Bottle Breast pump type: Double-Electric Breast Pump Pumping frequency: Mom pumping every 3 hrs Pumped volume: 4 mL  Interventions Interventions: Breast feeding basics reviewed;Skin to skin;Breast massage;Hand express;DEBP;Education;Pace feeding;LPT handout/interventions  Discharge Discharge Education: Engorgement and breast care;Warning signs for feeding baby;Outpatient recommendation Pump: Stork Pump  Consult Status Consult Status: Complete (mother declined follow up) Date: 07/30/22 Follow-up type: Call as needed    Valerie Black 07/30/2022, 8:33 AM

## 2022-08-01 ENCOUNTER — Ambulatory Visit: Payer: Self-pay

## 2022-08-01 NOTE — Lactation Note (Signed)
This note was copied from a baby's chart. Lactation Consultation Note  Patient Name: Valerie Black TMYTR'[id number] Date: 08/01/2022 Reason for consult: Follow-up assessment;Mother's request;Exclusive pumping and bottle feeding;Engorgement;RN request;Breastfeeding assistance Age:25 days  RN, Valerie Black called me to examine birth parents breast for engorgement. Birth parent used heat, pumping with pain. LC had birth parent to recline and applied ice for 25 mins.  Birth parent to do reverse pressure, gentle massage and pump to relieve discomfort repeatedly after each feeding. Birth parent set to go home. LC to send outpatient referral for birth parent to follow up with after discharge.  Birth parent has medela pump for home.  All questions answered at the end of the visit.   Maternal Data    Feeding Nipple Type: Nfant Slow Flow (purple)  LATCH Score                    Lactation Tools Discussed/Used    Interventions Interventions: Breast feeding basics reviewed;Hand express;Reverse pressure;Coconut oil;Hand pump;Ice;Education;Pace feeding;LC Services brochure  Discharge Discharge Education: Engorgement and breast care;Warning signs for feeding baby;Outpatient recommendation;Outpatient Epic message sent Pump: Personal;DEBP  Consult Status Consult Status: Complete Date: 08/01/22    Lacey Wallman  Nicholson-Springer 08/01/2022, 3:50 PM

## 2022-08-04 ENCOUNTER — Telehealth (HOSPITAL_COMMUNITY): Payer: Self-pay | Admitting: *Deleted

## 2022-08-04 NOTE — Telephone Encounter (Signed)
Mom reports feeling good. No concerns about herself at this time. EPDS declined, but feeling well emotionally. [facility] score=3) Mom reports baby is doing well. Feeding, peeing, and pooping without difficulty. Safe sleep reviewed. Mom reports no concerns about baby at present.  Duffy Rhody, RN 08-04-2022 at 2:57pm

## 2022-08-25 ENCOUNTER — Inpatient Hospital Stay (HOSPITAL_COMMUNITY): Admission: AD | Admit: 2022-08-25 | Payer: BC Managed Care – PPO | Source: Home / Self Care | Admitting: Obstetrics

## 2023-01-05 ENCOUNTER — Other Ambulatory Visit: Payer: Self-pay | Admitting: Obstetrics

## 2023-01-05 DIAGNOSIS — R1900 Intra-abdominal and pelvic swelling, mass and lump, unspecified site: Secondary | ICD-10-CM

## 2023-01-28 ENCOUNTER — Ambulatory Visit
Admission: RE | Admit: 2023-01-28 | Discharge: 2023-01-28 | Disposition: A | Discharge status: Home / Self Care | Payer: BC Managed Care – PPO | Source: Ambulatory Visit | Attending: Obstetrics | Admitting: Obstetrics

## 2023-01-28 DIAGNOSIS — R1900 Intra-abdominal and pelvic swelling, mass and lump, unspecified site: Secondary | ICD-10-CM

## 2023-01-28 MED ORDER — GADOPICLENOL 0.5 MMOL/ML IV SOLN
7.0000 mL | Freq: Once | INTRAVENOUS | Status: AC | PRN
  Administered 2023-01-28: 7 mL via INTRAVENOUS

## 2023-06-29 ENCOUNTER — Telehealth: Payer: Self-pay | Admitting: Family Medicine

## 2023-06-29 NOTE — Telephone Encounter (Signed)
error 

## 2023-07-11 ENCOUNTER — Telehealth: Payer: Self-pay | Admitting: Family Medicine

## 2023-07-11 ENCOUNTER — Emergency Department (HOSPITAL_COMMUNITY)
Admission: EM | Admit: 2023-07-11 | Discharge: 2023-07-11 | Disposition: A | Discharge status: Home / Self Care | Payer: BC Managed Care – PPO | Source: Home / Self Care | Attending: Emergency Medicine | Admitting: Emergency Medicine

## 2023-07-11 ENCOUNTER — Emergency Department (HOSPITAL_COMMUNITY): Payer: BC Managed Care – PPO

## 2023-07-11 ENCOUNTER — Encounter (HOSPITAL_COMMUNITY): Payer: Self-pay

## 2023-07-11 ENCOUNTER — Emergency Department (HOSPITAL_BASED_OUTPATIENT_CLINIC_OR_DEPARTMENT_OTHER): Payer: BC Managed Care – PPO

## 2023-07-11 DIAGNOSIS — R519 Headache, unspecified: Secondary | ICD-10-CM | POA: Insufficient documentation

## 2023-07-11 DIAGNOSIS — R200 Anesthesia of skin: Secondary | ICD-10-CM | POA: Insufficient documentation

## 2023-07-11 DIAGNOSIS — R42 Dizziness and giddiness: Secondary | ICD-10-CM | POA: Diagnosis present

## 2023-07-11 DIAGNOSIS — R202 Paresthesia of skin: Secondary | ICD-10-CM | POA: Insufficient documentation

## 2023-07-11 DIAGNOSIS — M79661 Pain in right lower leg: Secondary | ICD-10-CM | POA: Diagnosis not present

## 2023-07-11 DIAGNOSIS — R002 Palpitations: Secondary | ICD-10-CM | POA: Diagnosis not present

## 2023-07-11 LAB — URINALYSIS, ROUTINE W REFLEX MICROSCOPIC
Bacteria, UA: NONE SEEN
Bilirubin Urine: NEGATIVE
Glucose, UA: NEGATIVE mg/dL
Ketones, ur: NEGATIVE mg/dL
Leukocytes,Ua: NEGATIVE
Nitrite: NEGATIVE
Protein, ur: NEGATIVE mg/dL
Specific Gravity, Urine: 1.015 (ref 1.005–1.030)
pH: 7 (ref 5.0–8.0)

## 2023-07-11 LAB — CBC WITH DIFFERENTIAL/PLATELET
Abs Immature Granulocytes: 0.01 10*3/uL (ref 0.00–0.07)
Basophils Absolute: 0 10*3/uL (ref 0.0–0.1)
Basophils Relative: 1 %
Eosinophils Absolute: 0.1 10*3/uL (ref 0.0–0.5)
Eosinophils Relative: 2 %
HCT: 38.1 % (ref 36.0–46.0)
Hemoglobin: 12.2 g/dL (ref 12.0–15.0)
Immature Granulocytes: 0 %
Lymphocytes Relative: 41 %
Lymphs Abs: 2.2 10*3/uL (ref 0.7–4.0)
MCH: 29 pg (ref 26.0–34.0)
MCHC: 32 g/dL (ref 30.0–36.0)
MCV: 90.7 fL (ref 80.0–100.0)
Monocytes Absolute: 0.4 10*3/uL (ref 0.1–1.0)
Monocytes Relative: 7 %
Neutro Abs: 2.7 10*3/uL (ref 1.7–7.7)
Neutrophils Relative %: 49 %
Platelets: 181 10*3/uL (ref 150–400)
RBC: 4.2 MIL/uL (ref 3.87–5.11)
RDW: 11.9 % (ref 11.5–15.5)
WBC: 5.5 10*3/uL (ref 4.0–10.5)
nRBC: 0 % (ref 0.0–0.2)

## 2023-07-11 LAB — BASIC METABOLIC PANEL
Anion gap: 9 (ref 5–15)
BUN: 14 mg/dL (ref 6–20)
CO2: 26 mmol/L (ref 22–32)
Calcium: 9.5 mg/dL (ref 8.9–10.3)
Chloride: 105 mmol/L (ref 98–111)
Creatinine, Ser: 0.66 mg/dL (ref 0.44–1.00)
GFR, Estimated: >60 mL/min (ref >60)
Glucose, Bld: 105 mg/dL — ABNORMAL HIGH (ref 70–99)
Potassium: 3.6 mmol/L (ref 3.5–5.1)
Sodium: 140 mmol/L (ref 135–145)

## 2023-07-11 LAB — TROPONIN I (HIGH SENSITIVITY)
Troponin I (High Sensitivity): 9 ng/L (ref <18)
Troponin I (High Sensitivity): 10 ng/L (ref <18)

## 2023-07-11 LAB — MAGNESIUM: Magnesium: 2.1 mg/dL (ref 1.7–2.4)

## 2023-07-11 MED ORDER — SODIUM CHLORIDE 0.9 % IV BOLUS
1000.0000 mL | Freq: Once | INTRAVENOUS | Status: AC
  Administered 2023-07-11: 1000 mL via INTRAVENOUS

## 2023-07-11 NOTE — Progress Notes (Signed)
Right lower extremity venous duplex has been completed. Preliminary results can be found in CV Proc through chart review.  Results were given to [facility] PA.  07/11/23 4:31 PM Olen Cordial RVT

## 2023-07-11 NOTE — ED Triage Notes (Signed)
Reports numbness and tingling to R upper and lower extremity since Saturday. Sent over for DVT eval. Reports No recent travel. Denies swelling, cp, shob or any other symptoms

## 2023-07-11 NOTE — ED Provider Triage Note (Addendum)
Emergency Medicine Provider Triage Evaluation Note  Valerie Black , a 27 y.o. female  was evaluated in triage.  Pt complains of right leg pain and swelling. Started on Sat. Also felt like she was going to pass out. Tingling to right leg and "falling asleep" to right arm." No CP, SOB. No hx of PE, DVT, recent surgery, immobilization. Sent by PCP for PCP r/o. Some black spot in vision bil since Sat more prominent however has been going on for some time.. No hx of MS, CVA. No back pain, abd pain, falls  Review of Systems  Positive: Tingling, right leg pain, swelling Negative: fever  Physical Exam  There were no vitals taken for this visit. Gen:   Awake, no distress   Resp:  Normal effort  MSK:   Moves extremities without difficulty  Other:  Superficial vein to right anterior medial calf into distal femur.  Medical Decision Making  Medically screening exam initiated at 3:58 PM.  Appropriate orders placed.  Valerie Black was informed that the remainder of the evaluation will be completed by another provider, this initial triage assessment does not replace that evaluation, and the importance of remaining in the ED until their evaluation is complete.  Near syncope, right leg pain, tingling    Alonie Gazzola A, PA-C 07/11/23 1601

## 2023-07-11 NOTE — Telephone Encounter (Signed)
Just FYI I sent this pt to [facility] by mistake I saw Dr Doreene Burke and assumed she has been here recent. It has been since 2019. She called with symptoms of a blood clot and I was worried. I apologize.

## 2023-07-11 NOTE — Discharge Instructions (Signed)
It was a pleasure taking care of you today!  Your labs did not show any concerning emergent findings at this time.  Your imaging studies did not show any concerning emergent findings at this time.  There is no concern for DVT today on your ultrasound.  You will be given a referral to cardiology for follow-up, they will call you and set up a follow-up appointment regarding today's ED visit.  Attached is information for the vascular surgeon, call and set up a follow-up appointment regarding today's ED visit.  Continue with elevation of the leg.  Also continue with wearing compression stockings.  Follow-up with your primary care provider regarding today's ED visit.  Return to the emergency department if you experience increasing/worsening symptoms.

## 2023-07-11 NOTE — ED Provider Notes (Signed)
[place] EMERGENCY DEPARTMENT AT [facility] Provider Note   CSN: [id number] Arrival date & time: 07/11/23  1534     History  Chief Complaint  Patient presents with   Numbness    Right arm and leg since saturday   Dizziness    Valerie Black is a 27 y.o. female who presents emergency department with concerns for feeling lightheaded/dizzy onset 3 days.  Notes that she was taking the groceries in from outside when she felt presyncopal.  Her symptoms have been intermittent.  On yesterday she noted a prominent vein to her right lower extremity that has improved with elevation and compression stockings.  She voiced concerns for a DVT.  Also noted decreased sensation to the right lower extremity that was alleviated with the elevation and compression stockings.  She noted decreased sensation to her right upper extremity that has improved at this time.  She also notes a frontal headache that has been intermittent.  Denies chest pain, shortness of breath, nausea, vomiting. Pt denies recent travel, immobilization, surgery, estrogen use, birth control use, or PMHx of PE/DVT.   The history is provided by the patient. No language interpreter was used.       Home Medications Prior to Admission medications   Medication Sig Start Date End Date Taking? Authorizing Provider  acetaminophen (TYLENOL) 325 MG tablet Take 2 tablets (650 mg total) by mouth every 4 (four) hours as needed (for pain scale < 4). 07/29/22   Neta Mends, CNM  benzocaine-Menthol (DERMOPLAST) 20-0.5 % AERO Apply 1 Application topically as needed for irritation (perineal discomfort). 07/29/22   Neta Mends, CNM  coconut oil OIL Apply 1 Application topically as needed. 07/29/22   Neta Mends, CNM  ibuprofen (ADVIL) 600 MG tablet Take 1 tablet (600 mg total) by mouth every 6 (six) hours. 07/29/22   Neta Mends, CNM  prenatal vitamin w/FE, FA (PRENATAL 1 + 1) 27-1 MG TABS tablet Take 1 tablet by mouth daily at  12 noon.    [provider]      Allergies    Patient has no known allergies.    Review of Systems   Review of Systems  Neurological:  Positive for dizziness.  All other systems reviewed and are negative.   Physical Exam Updated Vital Signs BP (!) 145/92   Pulse 98   Temp 99.2 F (37.3 C) (Oral)   Resp 18   LMP 07/08/2023 (Exact Date)   SpO2 93%  Physical Exam Vitals and nursing note reviewed.  Constitutional:      General: She is not in acute distress.    Appearance: She is not diaphoretic.  HENT:     Head: Normocephalic and atraumatic.     Mouth/Throat:     Pharynx: No oropharyngeal exudate.  Eyes:     General: No scleral icterus.    Conjunctiva/sclera: Conjunctivae normal.  Cardiovascular:     Rate and Rhythm: Normal rate and regular rhythm.     Pulses: Normal pulses.     Heart sounds: Normal heart sounds.  Pulmonary:     Effort: Pulmonary effort is normal. No respiratory distress.     Breath sounds: Normal breath sounds. No wheezing.  Abdominal:     General: Bowel sounds are normal.     Palpations: Abdomen is soft. There is no mass.     Tenderness: There is no abdominal tenderness. There is no guarding or rebound.  Musculoskeletal:  General: Normal range of motion.     Cervical back: Normal range of motion and neck supple.  Skin:    General: Skin is warm and dry.  Neurological:     Mental Status: She is alert.     Comments: No focal neurological deficits. Negative pronator drift. Able to ambulate without assistance or difficulty. Strength and sensation intact to BUE and BLE. Grip strength 5/5 bilaterally.  Normal finger-nose testing.  Normal heel-to-shin testing.  Cranial nerves II through XII intact.  Psychiatric:        Behavior: Behavior normal.     ED Results / Procedures / Treatments   Labs (all labs ordered are listed, but only abnormal results are displayed) Labs Reviewed  BASIC METABOLIC PANEL - Abnormal; Notable for the  following components:      Result Value   Glucose, Bld 105 (*)    All other components within normal limits  URINALYSIS, ROUTINE W REFLEX MICROSCOPIC - Abnormal; Notable for the following components:   Hgb urine dipstick MODERATE (*)    All other components within normal limits  CBC WITH DIFFERENTIAL/PLATELET  MAGNESIUM  HCG, SERUM, QUALITATIVE  TROPONIN I (HIGH SENSITIVITY)  TROPONIN I (HIGH SENSITIVITY)    EKG EKG Interpretation Date/Time:  Monday July 11 2023 16:11:40 EDT Ventricular Rate:  88 PR Interval:  118 QRS Duration:  93 QT Interval:  450 QTC Calculation: 545 R Axis:   79  Text Interpretation: Sinus rhythm Borderline short PR interval Prolonged QT interval Confirmed by Vivi Barrack [phone]) on 07/11/2023 8:46:05 PM  Radiology VAS [place] LOWER EXTREMITY VENOUS (DVT) (ONLY [facility] & [facility])  Result Date: 07/11/2023  Lower Venous DVT Study Patient Name:  Valerie Black  Date of Exam:   07/11/2023 Medical Rec #: [id number]        Accession #:    [id number] Date of Birth: Mar 13, 1996        Patient Gender: F Patient Age:   59 years Exam Location:  [facility] Procedure:      VAS [place] LOWER EXTREMITY VENOUS (DVT) Referring Phys: BRITNI HENDERLY --------------------------------------------------------------------------------  Indications: Pain.  Risk Factors: None identified. Comparison Study: No prior studies. Performing Technologist: Chanda Busing RVT  Examination Guidelines: A complete evaluation includes B-mode imaging, spectral Doppler, color Doppler, and power Doppler as needed of all accessible portions of each vessel. Bilateral testing is considered an integral part of a complete examination. Limited examinations for reoccurring indications may be performed as noted. The reflux portion of the exam is performed with the patient in reverse Trendelenburg.  +---------+---------------+---------+-----------+----------+--------------+ RIGHT     CompressibilityPhasicitySpontaneityPropertiesThrombus Aging +---------+---------------+---------+-----------+----------+--------------+ CFV      Full           Yes      Yes                                 +---------+---------------+---------+-----------+----------+--------------+ SFJ      Full                                                        +---------+---------------+---------+-----------+----------+--------------+ FV Prox  Full                                                        +---------+---------------+---------+-----------+----------+--------------+  FV Mid   Full                                                        +---------+---------------+---------+-----------+----------+--------------+ FV DistalFull                                                        +---------+---------------+---------+-----------+----------+--------------+ PFV      Full                                                        +---------+---------------+---------+-----------+----------+--------------+ POP      Full           Yes      Yes                                 +---------+---------------+---------+-----------+----------+--------------+ PTV      Full                                                        +---------+---------------+---------+-----------+----------+--------------+ PERO     Full                                                        +---------+---------------+---------+-----------+----------+--------------+ GSV      Full                                                        +---------+---------------+---------+-----------+----------+--------------+   +----+---------------+---------+-----------+----------+--------------+ LEFTCompressibilityPhasicitySpontaneityPropertiesThrombus Aging +----+---------------+---------+-----------+----------+--------------+ CFV Full           Yes      Yes                                  +----+---------------+---------+-----------+----------+--------------+     Summary: RIGHT: - There is no evidence of deep vein thrombosis in the lower extremity.  - No cystic structure found in the popliteal fossa.  LEFT: - No evidence of common femoral vein obstruction.  *See table(s) above for measurements and observations. Electronically signed by Lemar Livings MD on 07/11/2023 at 6:59:47 PM.    Final    CT HEAD WO CONTRAST ( )  Result Date: 07/11/2023 CLINICAL DATA:  Right leg pain and paresthesias EXAM: CT HEAD WITHOUT CONTRAST TECHNIQUE: Contiguous axial images were obtained from the base of the skull through the vertex without intravenous contrast. RADIATION DOSE REDUCTION: This exam was performed according to  the departmental dose-optimization program which includes automated exposure control, adjustment of the mA and/or kV according to patient size and/or use of iterative reconstruction technique. COMPARISON:  07/21/2018 FINDINGS: Brain: No evidence of acute infarction, hemorrhage, hydrocephalus, extra-axial collection or mass lesion/mass effect. Vascular: No hyperdense vessel or unexpected calcification. Skull: Normal. Negative for fracture or focal lesion. Sinuses/Orbits: No acute finding. Other: None. IMPRESSION: Normal head CT without contrast for age Electronically Signed   By: Judie Petit.  Shick M.D.   On: 07/11/2023 16:30    Procedures Procedures    Medications Ordered in ED Medications  sodium chloride 0.9 % bolus 1,000 mL (0 mLs Intravenous Stopped 07/11/23 2146)    ED Course/ Medical Decision Making/ A&P Clinical Course as of 07/11/23 2151  Mon Jul 11, 2023  2131 Discussed with patient and husband at bedside regarding lab and imaging findings.  Discussed with patient discharge treatment plan.  Answered all available questions.  Patient appears safe for discharge at this time. [SB]    Clinical Course User Index [SB] Jason Hauge A, PA-C                                 Medical  Decision Making  Pt presents with concerns for paresthesia, lightheadedness, dizziness onset 3 days. Vital signs, patient afebrile. On exam, pt with no focal neurological deficits. No acute cardiovascular, respiratory, abdominal exam findings. Differential diagnosis includes CVA, TIA, DVT, electrolyte abnormality, hypoglycemia, acute cystitis, arrhythmia, anemia.   Labs:  I ordered, and personally interpreted labs.  The pertinent results include:   Initial troponin 10, delta troponin at 9 Magnesium 2.1 Urinalysis notable for moderate amount of hemoglobin otherwise unremarkable (patient is currently on her menstrual cycle BMP unremarkable CBC unremarkable  Imaging: I ordered imaging studies including CT head, DVT ultrasound study I independently visualized and interpreted imaging which showed: No acute findings noted on imaging studies I agree with the radiologist interpretation  Medications:  I ordered medication including IVF for symptom management Reevaluation of the patient after these medicines and interventions, I reevaluated the patient and found that they have improved I have reviewed the patients home medicines and have made adjustments as needed   Disposition: Presentation suspicious for improved paresthesia, secondary to prominent vein to right shin.  Also notable for intermittent palpitations and dizziness.  Doubt CVA or TIA at this time.  Doubt electrolyte abnormality, hypoglycemia, acute cystitis, arrhythmia, anemia. After consideration of the diagnostic results and the patients response to treatment, I feel that the patient would benefit from Discharge home.  Patient provided with information for vascular specialist as well as referral for cardiology given today.  Supportive care measures and strict return precautions discussed with patient at bedside. Pt acknowledges and verbalizes understanding. Pt appears safe for discharge. Follow up as indicated in discharge paperwork.     This chart was dictated using voice recognition software, Dragon. Despite the best efforts of this provider to proofread and correct errors, errors may still occur which can change documentation meaning.  Final Clinical Impression(s) / ED Diagnoses Final diagnoses:  Paresthesia  Dizziness  Palpitations    Rx / DC Orders ED Discharge Orders          Ordered    Ambulatory referral to Cardiology       Comments: If you have not heard from the Cardiology office within the next 72 hours please call [phone].   07/11/23 2134  Rielly Brunn A, PA-C 07/11/23 2153    Loetta Rough, MD 07/12/23 2322

## 2024-02-22 ENCOUNTER — Inpatient Hospital Stay (HOSPITAL_COMMUNITY)
Admission: AD | Admit: 2024-02-22 | Discharge: 2024-02-22 | Disposition: A | Discharge status: Home / Self Care | Attending: Obstetrics and Gynecology | Admitting: Obstetrics and Gynecology

## 2024-02-22 ENCOUNTER — Encounter (HOSPITAL_COMMUNITY): Payer: Self-pay | Admitting: *Deleted

## 2024-02-22 DIAGNOSIS — Z3A11 11 weeks gestation of pregnancy: Secondary | ICD-10-CM | POA: Diagnosis not present

## 2024-02-22 DIAGNOSIS — R030 Elevated blood-pressure reading, without diagnosis of hypertension: Secondary | ICD-10-CM | POA: Insufficient documentation

## 2024-02-22 DIAGNOSIS — O26891 Other specified pregnancy related conditions, first trimester: Secondary | ICD-10-CM | POA: Diagnosis not present

## 2024-02-22 DIAGNOSIS — R519 Headache, unspecified: Secondary | ICD-10-CM | POA: Insufficient documentation

## 2024-02-22 DIAGNOSIS — O161 Unspecified maternal hypertension, first trimester: Secondary | ICD-10-CM

## 2024-02-22 LAB — COMPREHENSIVE METABOLIC PANEL
ALT: 14 U/L (ref 0–44)
AST: 18 U/L (ref 15–41)
Albumin: 3.7 g/dL (ref 3.5–5.0)
Alkaline Phosphatase: 38 U/L (ref 38–126)
Anion gap: 9 (ref 5–15)
BUN: 5 mg/dL — ABNORMAL LOW (ref 6–20)
CO2: 23 mmol/L (ref 22–32)
Calcium: 9.4 mg/dL (ref 8.9–10.3)
Chloride: 103 mmol/L (ref 98–111)
Creatinine, Ser: 0.44 mg/dL (ref 0.44–1.00)
GFR, Estimated: >60 mL/min (ref >60)
Glucose, Bld: 88 mg/dL (ref 70–99)
Potassium: 3.8 mmol/L (ref 3.5–5.1)
Sodium: 135 mmol/L (ref 135–145)
Total Bilirubin: 0.5 mg/dL (ref 0.0–1.2)
Total Protein: 6.9 g/dL (ref 6.5–8.1)

## 2024-02-22 LAB — URINALYSIS, ROUTINE W REFLEX MICROSCOPIC
Bilirubin Urine: NEGATIVE
Glucose, UA: NEGATIVE mg/dL
Hgb urine dipstick: NEGATIVE
Ketones, ur: NEGATIVE mg/dL
Nitrite: NEGATIVE
Protein, ur: NEGATIVE mg/dL
Specific Gravity, Urine: 1.006 (ref 1.005–1.030)
pH: 6 (ref 5.0–8.0)

## 2024-02-22 LAB — CBC
HCT: 33.5 % — ABNORMAL LOW (ref 36.0–46.0)
Hemoglobin: 11.8 g/dL — ABNORMAL LOW (ref 12.0–15.0)
MCH: 30.2 pg (ref 26.0–34.0)
MCHC: 35.2 g/dL (ref 30.0–36.0)
MCV: 85.7 fL (ref 80.0–100.0)
Platelets: 202 10*3/uL (ref 150–400)
RBC: 3.91 MIL/uL (ref 3.87–5.11)
RDW: 12.1 % (ref 11.5–15.5)
WBC: 10.2 10*3/uL (ref 4.0–10.5)
nRBC: 0 % (ref 0.0–0.2)

## 2024-02-22 LAB — PROTEIN / CREATININE RATIO, URINE
Creatinine, Urine: 39 mg/dL
Total Protein, Urine: <6 mg/dL

## 2024-02-22 MED ORDER — CYCLOBENZAPRINE HCL 10 MG PO TABS: 10.0000 mg | ORAL_TABLET | Freq: Two times a day (BID) | ORAL | 0 refills | Status: AC | PRN

## 2024-02-22 MED ORDER — LACTATED RINGERS IV BOLUS
1000.0000 mL | Freq: Once | INTRAVENOUS | Status: AC
  Administered 2024-02-22: 1000 mL via INTRAVENOUS

## 2024-02-22 MED ORDER — CYCLOBENZAPRINE HCL 5 MG PO TABS
10.0000 mg | ORAL_TABLET | Freq: Once | ORAL | Status: AC
  Administered 2024-02-22: 10 mg via ORAL
  Filled 2024-02-22: qty 2

## 2024-02-22 NOTE — MAU Note (Signed)
.  Valerie Black is a 28 y.o. at [redacted]w[redacted]d here in MAU reporting: Pregnant with Twins. Stated she ahd not felt well all day. Had a headache after lunch that has gotten worse. Took her b/p and it was 152/82.waited until end of the day and rechecked her b/e and it was 174/83. Has not taken anything for the headache, called her OB and he told her ot come toMAU to see if she was dehydrated .  LMP:  Onset of complaint: today  Pain score: 6  Vitals:   02/22/24 1842  BP: (!) 158/84  Pulse: (!) 113  Resp: 18  Temp: 98.8 F (37.1 C)     FHT: A165  B- unsure  Lab orders placed from triage: u/a

## 2024-02-22 NOTE — MAU Provider Note (Signed)
 [facility] Attending MAU Note  Chief Complaint: Hypertension and Headache    Event Date/Time   First Provider Initiated Contact with Patient 02/22/24 1906      SUBJECTIVE Valerie Black is a 28 y.o. G2P0101 at [redacted]w[redacted]d by LMP who presents with headache, elevated blood pressure. She noted feeling headache and dizziness. She had eaten. Took BP at work and it was elevated to 174/83. Headache with throbbing. Has h/o migraines, but this was much worse. She has on-going n/v despite Diclegis usage.  Called after hours work line and told to come in for eval. No prior history of HTN, except some mild elevations at end of last pregnancy, which resolved pp.  Past Medical History:  Diagnosis Date   Anemia    Asthma    exercise induced-on no medications   Frequent headaches    Migraines    OB History  Gravida Para Term Preterm AB Living  2 1  1  1   SAB IAB Ectopic Multiple Live Births     0 1    # Outcome Date GA Lbr Len/2nd Weight Sex Type Anes PTL Lv  2 Current           1 Preterm 07/27/22 [redacted]w[redacted]d 16:51 / 00:22 2170 g M Vag-Spont EPI  LIV     Birth Comments: wnl   Past Surgical History:  Procedure Laterality Date   ADENOIDECTOMY     COSMETIC SURGERY Bilateral 01/27/2017   breast   DERMOID CYST  EXCISION     age 43 years (neck)   TONSILLECTOMY     TYMPANOSTOMY TUBE PLACEMENT     28 years of age   52 TOOTH EXTRACTION Bilateral    Social History   Socioeconomic History   Marital status: Married    Spouse name: Not on file   Number of children: Not on file   Years of education: Not on file   Highest education level: Not on file  Occupational History   Not on file  Tobacco Use   Smoking status: Never   Smokeless tobacco: Never  Vaping Use   Vaping status: Never Used  Substance and Sexual Activity   Alcohol use: Never   Drug use: Never   Sexual activity: Yes  Other Topics Concern   Not on file  Social History Narrative   Not on file   Social Drivers of  Health   Financial Resource Strain: Not on file  Food Insecurity: Not on file  Transportation Needs: Not on file  Physical Activity: Not on file  Stress: Not on file  Social Connections: Not on file  Intimate Partner Violence: Not on file   No current facility-administered medications on file prior to encounter.   Current Outpatient Medications on File Prior to Encounter  Medication Sig Dispense Refill   prenatal vitamin w/FE, FA (PRENATAL 1 + 1) 27-1 MG TABS tablet Take 1 tablet by mouth daily at 12 noon.     acetaminophen (TYLENOL) 325 MG tablet Take 2 tablets (650 mg total) by mouth every 4 (four) hours as needed (for pain scale < 4).     benzocaine-Menthol (DERMOPLAST) 20-0.5 % AERO Apply 1 Application topically as needed for irritation (perineal discomfort).     coconut oil OIL Apply 1 Application topically as needed.  0   ibuprofen (ADVIL) 600 MG tablet Take 1 tablet (600 mg total) by mouth every 6 (six) hours. 30 tablet 0   No Known Allergies  ROS: Pertinent items in HPI  OBJECTIVE BP 108/69   Pulse 95   Temp 98.8 F (37.1 C)   Resp 18   Ht 5\' 9"  (1.753 m)   Wt 68 kg   LMP 07/08/2023 (Exact Date)   BMI 22.15 kg/m  CONSTITUTIONAL: Well-developed, well-nourished female in no acute distress.  HENT:  Normocephalic, atraumatic,  EYES: Conjunctivae and EOM are normal.  No scleral icterus.  NECK: Normal range of motion, supple, no masses.  Normal thyroid.  SKIN: Skin is warm and dry. No rash noted. Not diaphoretic. No erythema. No pallor. NEUROLGIC: Alert and oriented to person, place, and time. Marland Kitchen CARDIOVASCULAR: Normal heart rate noted RESPIRATORY: Effort and breath sounds normal, no problems with respiration noted. ABDOMEN: Soft, normal bowel sounds, no distention noted.  No tenderness, rebound or guarding.  MUSCULOSKELETAL: Normal range of motion. No tenderness.  No cyanosis, clubbing, or edema.  2+ distal pulses.  LAB RESULTS Results for orders placed or performed  during the hospital encounter of 02/22/24 (from the past 48 hours)  Comprehensive metabolic panel     Status: Abnormal   Collection Time: 02/22/24  7:18 PM  Result Value Ref Range   Sodium 135 135 - 145 mmol/L   Potassium 3.8 3.5 - 5.1 mmol/L   Chloride 103 98 - 111 mmol/L   CO2 23 22 - 32 mmol/L   Glucose, Bld 88 70 - 99 mg/dL    Comment: Glucose reference range applies only to samples taken after fasting for at least 8 hours.   BUN 5 (L) 6 - 20 mg/dL   Creatinine, Ser 4.09 0.44 - 1.00 mg/dL   Calcium 9.4 8.9 - 81.1 mg/dL   Total Protein 6.9 6.5 - 8.1 g/dL   Albumin 3.7 3.5 - 5.0 g/dL   AST 18 15 - 41 U/L   ALT 14 0 - 44 U/L   Alkaline Phosphatase 38 38 - 126 U/L   Total Bilirubin 0.5 0.0 - 1.2 mg/dL   GFR, Estimated >91 >47 mL/min    Comment: (NOTE) Calculated using the CKD-EPI Creatinine Equation (2021)    Anion gap 9 5 - 15    Comment: Performed at [facility], [address] [place], [place] [address]  CBC     Status: Abnormal   Collection Time: 02/22/24  7:18 PM  Result Value Ref Range   WBC 10.2 4.0 - 10.5 K/uL   RBC 3.91 3.87 - 5.11 MIL/uL   Hemoglobin 11.8 (L) 12.0 - 15.0 g/dL   HCT 21.3 (L) 08.6 - 57.8 %   MCV 85.7 80.0 - 100.0 fL   MCH 30.2 26.0 - 34.0 pg   MCHC 35.2 30.0 - 36.0 g/dL   RDW 46.9 62.9 - 52.8 %   Platelets 202 150 - 400 K/uL   nRBC 0.0 0.0 - 0.2 %    Comment: Performed at [facility], [address] [place], [place] [address]  Protein / creatinine ratio, urine     Status: None   Collection Time: 02/22/24  8:27 PM  Result Value Ref Range   Creatinine, Urine 39 mg/dL   Total Protein, Urine <6 mg/dL    Comment: NO NORMAL RANGE ESTABLISHED FOR THIS TEST   Protein Creatinine Ratio        0.00 - 0.15 mg/mg[Cre]    Comment: RESULT BELOW REPORTABLE RANGE, UNABLE TO CALCULATE. Performed at [facility], [address] [place], [place] [address]   Urinalysis, Routine w reflex microscopic -Urine, Clean Catch  Status: Abnormal    Collection Time: 02/22/24  8:27 PM  Result Value Ref Range   Color, Urine STRAW (A) YELLOW   APPearance CLEAR CLEAR   Specific Gravity, Urine 1.006 1.005 - 1.030   pH 6.0 5.0 - 8.0   Glucose, UA NEGATIVE NEGATIVE mg/dL   Hgb urine dipstick NEGATIVE NEGATIVE   Bilirubin Urine NEGATIVE NEGATIVE   Ketones, ur NEGATIVE NEGATIVE mg/dL   Protein, ur NEGATIVE NEGATIVE mg/dL   Nitrite NEGATIVE NEGATIVE   Leukocytes,Ua TRACE (A) NEGATIVE   RBC / HPF 0-5 0 - 5 RBC/hpf   WBC, UA 0-5 0 - 5 WBC/hpf   Bacteria, UA RARE (A) NONE SEEN   Squamous Epithelial / HPF 0-5 0 - 5 /HPF    Comment: Performed at [facility], [address], [place], [place] [address]    IMAGING No results found.  MAU COURSE Given 1 L LR Given flexeril Serial BP assessments, reveal much improved BP Labs WNL Headache resolved  ASSESSMENT 1. Elevated blood pressure affecting pregnancy in first trimester, antepartum   2. [redacted] weeks gestation of pregnancy   3. Pregnancy headache in first trimester     PLAN Discharge home Rx for flexeril Stay hydrated F/u with primary OB/GYN  Allergies as of 02/22/2024   No Known Allergies      Medication List     STOP taking these medications    benzocaine-Menthol 20-0.5 % Aero Commonly known as: DERMOPLAST   ibuprofen 600 MG tablet Commonly known as: ADVIL       TAKE these medications    acetaminophen 325 MG tablet Commonly known as: Tylenol Take 2 tablets (650 mg total) by mouth every 4 (four) hours as needed (for pain scale < 4).   coconut oil Oil Apply 1 Application topically as needed.   cyclobenzaprine 10 MG tablet Commonly known as: FLEXERIL Take 1 tablet (10 mg total) by mouth 2 (two) times daily as needed for muscle spasms.   prenatal vitamin w/FE, FA 27-1 MG Tabs tablet Take 1 tablet by mouth daily at 12 noon.        Evaluation does not show pathology that would require ongoing emergent intervention or inpatient treatment. Patient is  hemodynamically stable and mentating appropriately. Discussed findings and plan with patient, who agrees with care plan. All questions answered. Return precautions discussed and outpatient follow up recommendations given.  Reva Bores, MD 02/22/2024 9:12 PM

## 2024-06-02 ENCOUNTER — Encounter (HOSPITAL_COMMUNITY): Payer: Self-pay | Admitting: Obstetrics and Gynecology

## 2024-06-02 ENCOUNTER — Inpatient Hospital Stay (HOSPITAL_COMMUNITY)
Admission: AD | Admit: 2024-06-02 | Discharge: 2024-06-02 | Disposition: A | Discharge status: Home / Self Care | Attending: Obstetrics & Gynecology | Admitting: Obstetrics & Gynecology

## 2024-06-02 DIAGNOSIS — O99891 Other specified diseases and conditions complicating pregnancy: Secondary | ICD-10-CM | POA: Diagnosis not present

## 2024-06-02 DIAGNOSIS — O23592 Infection of other part of genital tract in pregnancy, second trimester: Secondary | ICD-10-CM | POA: Diagnosis not present

## 2024-06-02 DIAGNOSIS — R110 Nausea: Secondary | ICD-10-CM | POA: Diagnosis not present

## 2024-06-02 DIAGNOSIS — O09212 Supervision of pregnancy with history of pre-term labor, second trimester: Secondary | ICD-10-CM | POA: Insufficient documentation

## 2024-06-02 DIAGNOSIS — O26892 Other specified pregnancy related conditions, second trimester: Secondary | ICD-10-CM

## 2024-06-02 DIAGNOSIS — Z3A26 26 weeks gestation of pregnancy: Secondary | ICD-10-CM | POA: Diagnosis not present

## 2024-06-02 DIAGNOSIS — R000 Tachycardia, unspecified: Secondary | ICD-10-CM | POA: Insufficient documentation

## 2024-06-02 DIAGNOSIS — O99012 Anemia complicating pregnancy, second trimester: Secondary | ICD-10-CM | POA: Diagnosis not present

## 2024-06-02 DIAGNOSIS — O09292 Supervision of pregnancy with other poor reproductive or obstetric history, second trimester: Secondary | ICD-10-CM | POA: Diagnosis not present

## 2024-06-02 DIAGNOSIS — R9431 Abnormal electrocardiogram [ECG] [EKG]: Secondary | ICD-10-CM | POA: Insufficient documentation

## 2024-06-02 DIAGNOSIS — Z0371 Encounter for suspected problem with amniotic cavity and membrane ruled out: Secondary | ICD-10-CM | POA: Insufficient documentation

## 2024-06-02 DIAGNOSIS — B9689 Other specified bacterial agents as the cause of diseases classified elsewhere: Secondary | ICD-10-CM | POA: Diagnosis not present

## 2024-06-02 DIAGNOSIS — N898 Other specified noninflammatory disorders of vagina: Secondary | ICD-10-CM | POA: Diagnosis not present

## 2024-06-02 LAB — COMPREHENSIVE METABOLIC PANEL WITH GFR
ALT: 10 U/L (ref 0–44)
AST: 23 U/L (ref 15–41)
Albumin: 2.8 g/dL — ABNORMAL LOW (ref 3.5–5.0)
Alkaline Phosphatase: 67 U/L (ref 38–126)
Anion gap: 10 (ref 5–15)
BUN: 9 mg/dL (ref 6–20)
CO2: 21 mmol/L — ABNORMAL LOW (ref 22–32)
Calcium: 8.8 mg/dL — ABNORMAL LOW (ref 8.9–10.3)
Chloride: 104 mmol/L (ref 98–111)
Creatinine, Ser: 0.54 mg/dL (ref 0.44–1.00)
GFR, Estimated: >60 mL/min (ref >60)
Glucose, Bld: 117 mg/dL — ABNORMAL HIGH (ref 70–99)
Potassium: 4 mmol/L (ref 3.5–5.1)
Sodium: 135 mmol/L (ref 135–145)
Total Bilirubin: 0.4 mg/dL (ref 0.0–1.2)
Total Protein: 6.4 g/dL — ABNORMAL LOW (ref 6.5–8.1)

## 2024-06-02 LAB — CBC
HCT: 31.1 % — ABNORMAL LOW (ref 36.0–46.0)
Hemoglobin: 10.6 g/dL — ABNORMAL LOW (ref 12.0–15.0)
MCH: 31.3 pg (ref 26.0–34.0)
MCHC: 34.1 g/dL (ref 30.0–36.0)
MCV: 91.7 fL (ref 80.0–100.0)
Platelets: 186 K/uL (ref 150–400)
RBC: 3.39 MIL/uL — ABNORMAL LOW (ref 3.87–5.11)
RDW: 12.7 % (ref 11.5–15.5)
WBC: 10.3 K/uL (ref 4.0–10.5)
nRBC: 0 % (ref 0.0–0.2)

## 2024-06-02 LAB — WET PREP, GENITAL
Sperm: NONE SEEN
Trich, Wet Prep: NONE SEEN
WBC, Wet Prep HPF POC: >=10 — AB (ref <10)
Yeast Wet Prep HPF POC: NONE SEEN

## 2024-06-02 LAB — FETAL FIBRONECTIN: Fetal Fibronectin: NEGATIVE

## 2024-06-02 LAB — RUPTURE OF MEMBRANE (ROM)PLUS: Rom Plus: NEGATIVE

## 2024-06-02 LAB — URINALYSIS, ROUTINE W REFLEX MICROSCOPIC
Bilirubin Urine: NEGATIVE
Glucose, UA: NEGATIVE mg/dL
Hgb urine dipstick: NEGATIVE
Ketones, ur: NEGATIVE mg/dL
Nitrite: NEGATIVE
Protein, ur: NEGATIVE mg/dL
Specific Gravity, Urine: 1.012 (ref 1.005–1.030)
pH: 8 (ref 5.0–8.0)

## 2024-06-02 LAB — POCT FERN TEST: POCT Fern Test: NEGATIVE

## 2024-06-02 MED ORDER — ONDANSETRON HCL 4 MG/2ML IJ SOLN
4.0000 mg | Freq: Once | INTRAMUSCULAR | Status: AC
  Administered 2024-06-02: 4 mg via INTRAVENOUS
  Filled 2024-06-02: qty 2

## 2024-06-02 MED ORDER — LACTATED RINGERS IV BOLUS
1000.0000 mL | Freq: Once | INTRAVENOUS | Status: AC
  Administered 2024-06-02: 1000 mL via INTRAVENOUS

## 2024-06-02 MED ORDER — METRONIDAZOLE 500 MG PO TABS: 500.0000 mg | ORAL_TABLET | Freq: Two times a day (BID) | ORAL | 0 refills | Status: AC

## 2024-06-02 MED ORDER — ONDANSETRON 4 MG PO TBDP: 4.0000 mg | ORAL_TABLET | Freq: Three times a day (TID) | ORAL | 0 refills | Status: AC | PRN

## 2024-06-02 NOTE — Discharge Instructions (Signed)
 LOW HEMOGLOBIN: Low hemoglobin is very common during pregnancy and is usually because iron is low. I would recommend we try to improve you hemoglobin level. We know that improving your pre-delivery hemoglobin reduces your risk of a hemorrhage.   I would recommend you try a supplement called Blood Builder in addition to your prenatal vitamin. It is available via [employer] and also at stores like [employer]. It uses plant based iron and is less prone to causing stomach and bowel upset. Here is a screen shot of the supplement.   Alternatively, you can start a regular iron supplement in addition to your prenatal vitamin. Either ferrous sulfate , ferrous gluconate, or ferrous fumarate. The recommendation is 100 mg every other day. These can all be found over-the-counter.  For some people this can be constipating, so you can also add a daily stool softener (Miralax) if needed.  About 10% of people will have difficulty with iron and it will cause GI upset. To reduce the chance of GI upset, take the supplement with food. Taking it at night so you sleep through potential side effects can also be helpful. If that does not help, taking a liquid formulation with food can help cause less GI symptoms.

## 2024-06-02 NOTE — MAU Note (Signed)
 ALIZ MERITT is a 28 y.o. at [redacted]w[redacted]d here in MAU reporting: hx of preterm delivery due to PPROM. Irregular BH ongoing this past week and had cervical exam Tuesday and was told her cervix looked closed but is effaced. Pt sitting on the couch and noticed her underwear and shorts were wet. Clear fluid. Took a shower and changed and it happened again. Had an increase in mucus vaginal discharge 2 days ago. Denies any VB. Reports +FM from both babies. Denies any recent sexual intercourse, on pelvic rest. Next Ob appointment Wednesday.   Onset of complaint: today  Pain score: 0 Vitals:   06/02/24 1133  Pulse: 95  Resp: 16  Temp: 98.5 F (36.9 C)  SpO2: 98%     QYU:ajab A  153   baby B  146 Lab orders placed from triage:  [place], Labor eval

## 2024-06-02 NOTE — MAU Provider Note (Signed)
 Chief Complaint:  Rupture of Membranes   HPI   None     Valerie Black is a 28 y.o. G2P0101 at [redacted]w[redacted]d who presents to maternity admissions reporting leaking of fluid. Symptoms described as abnormal vaginal wetness. She was sitting on the couch and noticed that her underwear and shorts were wet with clear fluid. After showering and changing, she noticed her underwear and shorts were wet again. Denies recent intercourse, she is on pelvic rest. She had a cervical check this past week, was told her cervix was closed but effaced. She does not endorse contractions. At [redacted]w[redacted]d, patient is preterm. She has a history of preterm delivery at [redacted]w[redacted]d due to PPROM in her last pregnancy.  Additional history obtained from partner  Pregnancy Course: Receives care at [facility]. Prenatal records reviewed.   Past Medical History:  Diagnosis Date   Anemia    Asthma    exercise induced-on no medications   Frequent headaches    Migraines    OB History  Gravida Para Term Preterm AB Living  2 1  1  1   SAB IAB Ectopic Multiple Live Births     0 1    # Outcome Date GA Lbr Len/2nd Weight Sex Type Anes PTL Lv  2 Current           1 Preterm 07/27/22 [redacted]w[redacted]d 16:51 / 00:22 2170 g M Vag-Spont EPI  LIV     Birth Comments: wnl   Past Surgical History:  Procedure Laterality Date   ADENOIDECTOMY     COSMETIC SURGERY Bilateral 01/27/2017   breast   DERMOID CYST  EXCISION     age 19 years (neck)   TONSILLECTOMY     TYMPANOSTOMY TUBE PLACEMENT     28 years of age   43 TOOTH EXTRACTION Bilateral    Family History  Problem Relation Age of Onset   Hypertension Father    Cancer Maternal Grandmother    Cancer Paternal Grandmother    Heart attack Paternal Grandfather    Hyperlipidemia Paternal Grandfather    Hypertension Paternal Grandfather    Stroke Paternal Grandfather    Social History   Tobacco Use   Smoking status: Never   Smokeless tobacco: Never  Vaping Use   Vaping status: Never Used   Substance Use Topics   Alcohol use: Never   Drug use: Never   No Known Allergies Medications Prior to Admission  Medication Sig Dispense Refill Last Dose/Taking   prenatal vitamin w/FE, FA (PRENATAL 1 + 1) 27-1 MG TABS tablet Take 1 tablet by mouth daily at 12 noon.   06/01/2024 Noon   acetaminophen  (TYLENOL ) 325 MG tablet Take 2 tablets (650 mg total) by mouth every 4 (four) hours as needed (for pain scale < 4).   Unknown   coconut oil OIL Apply 1 Application topically as needed.  0    cyclobenzaprine  (FLEXERIL ) 10 MG tablet Take 1 tablet (10 mg total) by mouth 2 (two) times daily as needed for muscle spasms. 5 tablet 0     I have reviewed patient's Past Medical Hx, Surgical Hx, Family Hx, Social Hx, medications and allergies.   ROS  Pertinent items noted in HPI and remainder of comprehensive ROS otherwise negative.   PHYSICAL EXAM  Patient Vitals for the past 24 hrs:  BP Temp Temp src Pulse Resp SpO2 Height Weight  06/02/24 1335 -- -- -- -- -- 99 % -- --  06/02/24 1330 -- -- -- -- -- 99 % -- --  06/02/24 1325 (!) 98/57 -- -- (!) 104 16 99 % -- --  06/02/24 1320 -- -- -- -- -- 99 % -- --  06/02/24 1315 -- -- -- -- -- 100 % -- --  06/02/24 1310 -- -- -- -- -- 100 % -- --  06/02/24 1300 -- -- -- -- -- 99 % -- --  06/02/24 1255 -- -- -- -- -- 100 % -- --  06/02/24 1250 -- -- -- -- -- 100 % -- --  06/02/24 1245 -- -- -- -- -- 100 % -- --  06/02/24 1240 -- -- -- -- -- 98 % -- --  06/02/24 1235 -- -- -- -- -- 100 % -- --  06/02/24 1230 -- -- -- -- -- 99 % -- --  06/02/24 1225 -- -- -- -- -- 100 % -- --  06/02/24 1220 -- -- -- -- -- 100 % -- --  06/02/24 1215 -- -- -- -- -- 100 % -- --  06/02/24 1210 -- -- -- -- -- 100 % -- --  06/02/24 1205 -- -- -- -- -- 100 % -- --  06/02/24 1133 124/72 98.5 F (36.9 C) Oral 95 16 98 % 5' 9 (1.753 m) 74.1 kg    Constitutional: Well-developed, well-nourished female in no acute distress.  HEENT: atraumatic, normocephalic. Neck has normal ROM.  EOM intact. Cardiovascular: normal rate & rhythm, warm and well-perfused Respiratory: normal effort, no problems with respiration noted GI: Abd soft, non-tender, non-distended MSK: Extremities nontender, no edema, normal ROM Skin: warm and dry. Acyanotic, no jaundice or pallor. Neurologic: Alert and oriented x 4. No abnormal coordination. Psychiatric: Normal mood. Speech not slurred, not rapid/pressured. Patient is cooperative. GU: no CVA tenderness Pelvic exam: VULVA: normal appearing vulva with no masses, tenderness or lesions, VAGINA: normal appearing vagina with normal color and discharge, no lesions, CERVIX: multiparous os, visually closed, physiologic discharge, no pooling, exam chaperoned by Maurilio Hover RN.      Fetal Tracing: Toco: uterine irritability  Twin A Baseline FHR: 145 per minute Fetal heart variability: moderate Fetal Heart Rate accelerations: yes Fetal Heart Rate decelerations: none Fetal Non-stress Test: Category I (reactive) Twin B Baseline FHR: 150 per minute Fetal heart variability: moderate Fetal Heart Rate accelerations: yes Fetal Heart Rate decelerations: none Fetal Non-stress Test: Category I (reactive)  Labs: Results for orders placed or performed during the hospital encounter of 06/02/24 (from the past 24 hours)  Fetal fibronectin     Status: None   Collection Time: 06/02/24 12:28 PM  Result Value Ref Range   Fetal Fibronectin NEGATIVE NEGATIVE  Wet prep, genital     Status: Abnormal   Collection Time: 06/02/24 12:51 PM  Result Value Ref Range   Yeast Wet Prep HPF POC NONE SEEN NONE SEEN   Trich, Wet Prep NONE SEEN NONE SEEN   Clue Cells Wet Prep HPF POC PRESENT (A) NONE SEEN   WBC, Wet Prep HPF POC >=10 (A) <10   Sperm NONE SEEN   Urinalysis, Routine w reflex microscopic -Urine, Clean Catch     Status: Abnormal   Collection Time: 06/02/24 12:51 PM  Result Value Ref Range   Color, Urine YELLOW YELLOW   APPearance HAZY (A) CLEAR   Specific  Gravity, Urine 1.012 1.005 - 1.030   pH 8.0 5.0 - 8.0   Glucose, UA NEGATIVE NEGATIVE mg/dL   Hgb urine dipstick NEGATIVE NEGATIVE   Bilirubin Urine NEGATIVE NEGATIVE   Ketones, ur NEGATIVE NEGATIVE mg/dL   Protein,  ur NEGATIVE NEGATIVE mg/dL   Nitrite NEGATIVE NEGATIVE   Leukocytes,Ua LARGE (A) NEGATIVE   RBC / HPF 0-5 0 - 5 RBC/hpf   WBC, UA 6-10 0 - 5 WBC/hpf   Bacteria, UA RARE (A) NONE SEEN   Squamous Epithelial / HPF 6-10 0 - 5 /HPF   Mucus PRESENT   Rupture of Membrane (ROM) Plus     Status: None   Collection Time: 06/02/24 12:51 PM  Result Value Ref Range   Rom Plus NEGATIVE   POCT fern test     Status: None   Collection Time: 06/02/24 12:52 PM  Result Value Ref Range   POCT Fern Test Negative = intact amniotic membranes   CBC     Status: Abnormal   Collection Time: 06/02/24  1:22 PM  Result Value Ref Range   WBC 10.3 4.0 - 10.5 K/uL   RBC 3.39 (L) 3.87 - 5.11 MIL/uL   Hemoglobin 10.6 (L) 12.0 - 15.0 g/dL   HCT 68.8 (L) 63.9 - 53.9 %   MCV 91.7 80.0 - 100.0 fL   MCH 31.3 26.0 - 34.0 pg   MCHC 34.1 30.0 - 36.0 g/dL   RDW 87.2 88.4 - 84.4 %   Platelets 186 150 - 400 K/uL   nRBC 0.0 0.0 - 0.2 %  Comprehensive metabolic panel     Status: Abnormal   Collection Time: 06/02/24  1:22 PM  Result Value Ref Range   Sodium 135 135 - 145 mmol/L   Potassium 4.0 3.5 - 5.1 mmol/L   Chloride 104 98 - 111 mmol/L   CO2 21 (L) 22 - 32 mmol/L   Glucose, Bld 117 (H) 70 - 99 mg/dL   BUN 9 6 - 20 mg/dL   Creatinine, Ser 9.45 0.44 - 1.00 mg/dL   Calcium 8.8 (L) 8.9 - 10.3 mg/dL   Total Protein 6.4 (L) 6.5 - 8.1 g/dL   Albumin 2.8 (L) 3.5 - 5.0 g/dL   AST 23 15 - 41 U/L   ALT 10 0 - 44 U/L   Alkaline Phosphatase 67 38 - 126 U/L   Total Bilirubin 0.4 0.0 - 1.2 mg/dL   GFR, Estimated >39 >39 mL/min   Anion gap 10 5 - 15    Imaging:  No results found.  MDM & MAU COURSE  MDM: High  MAU Course: -Vital signs within normal limits.  -No pooling on exam. Collected fern, wet prep,  GC/chlamydia, fetal fibronectin. -CBC and CMP to evaluated episodic tachycardia with associated nausea. -IV fluids and Zofran  for nausea. -EKG to rule out arrhythmia.  -EKG with normal sinus rhythm. -CBC shows Hgb 10.6. Suspect that anemia is contributing to tachycardia, will recommend PO iron supplement and refer for IV iron supplement. -CMP within normal limits for pregnancy. -Fern, fetal fibronectin, and ROM Plus negative. -Wet prep positive for BV, will treat with metronidazole .  Orders Placed This Encounter  Procedures   Wet prep, genital   Urine Culture   Urinalysis, Routine w reflex microscopic -Urine, Clean Catch   Rupture of Membrane (ROM) Plus   Fetal fibronectin   CBC   Comprehensive metabolic panel   Amb Referral to Intravenous Iron Therapy   POCT fern test   ED EKG   Discharge patient   Meds ordered this encounter  Medications   lactated ringers  bolus 1,000 mL   ondansetron  (ZOFRAN ) injection 4 mg   metroNIDAZOLE  (FLAGYL ) 500 MG tablet    Sig: Take 1 tablet (500 mg total) by mouth  2 (two) times daily for 7 days.    Dispense:  14 tablet    Refill:  0   ondansetron  (ZOFRAN -ODT) 4 MG disintegrating tablet    Sig: Take 1 tablet (4 mg total) by mouth every 8 (eight) hours as needed for nausea or vomiting.    Dispense:  20 tablet    Refill:  0    ASSESSMENT   1. Vaginal discharge during pregnancy in second trimester   2. Bacterial vaginosis in pregnancy   3. Anemia during pregnancy in second trimester   4. [redacted] weeks gestation of pregnancy     PLAN  Discharge home in stable condition with preterm labor precautions.  Referral sent for IV iron. Start PO iron until infusions. Metronidazole  for BV. Zofran  sent to pharmacy for nausea.    Follow-up Information     [facility], [facility] Follow up.   Why: As scheduled for ongoing prenatal care Contact information: [address] [place] [address] [phone]                  Allergies as of  06/02/2024   No Known Allergies      Medication List     TAKE these medications    acetaminophen  325 MG tablet Commonly known as: Tylenol  Take 2 tablets (650 mg total) by mouth every 4 (four) hours as needed (for pain scale < 4).   coconut oil Oil Apply 1 Application topically as needed.   cyclobenzaprine  10 MG tablet Commonly known as: FLEXERIL  Take 1 tablet (10 mg total) by mouth 2 (two) times daily as needed for muscle spasms.   metroNIDAZOLE  500 MG tablet Commonly known as: FLAGYL  Take 1 tablet (500 mg total) by mouth 2 (two) times daily for 7 days.   ondansetron  4 MG disintegrating tablet Commonly known as: ZOFRAN -ODT Take 1 tablet (4 mg total) by mouth every 8 (eight) hours as needed for nausea or vomiting.   prenatal vitamin w/FE, FA 27-1 MG Tabs tablet Take 1 tablet by mouth daily at 12 noon.        Joesph DELENA Sear, PA

## 2024-06-03 ENCOUNTER — Ambulatory Visit: Payer: Self-pay | Admitting: Family Medicine

## 2024-06-03 LAB — URINE CULTURE: Culture: <10000 — AB

## 2024-06-04 ENCOUNTER — Telehealth (HOSPITAL_COMMUNITY): Payer: Self-pay

## 2024-06-04 ENCOUNTER — Telehealth (HOSPITAL_COMMUNITY): Payer: Self-pay | Admitting: Family Medicine

## 2024-06-04 ENCOUNTER — Other Ambulatory Visit (HOSPITAL_COMMUNITY): Payer: Self-pay | Admitting: Family Medicine

## 2024-06-04 DIAGNOSIS — D509 Iron deficiency anemia, unspecified: Secondary | ICD-10-CM

## 2024-06-04 DIAGNOSIS — O99012 Anemia complicating pregnancy, second trimester: Secondary | ICD-10-CM

## 2024-06-04 LAB — GC/CHLAMYDIA PROBE AMP (~~LOC~~) NOT AT ARMC
Chlamydia: NEGATIVE
Comment: NEGATIVE
Comment: NORMAL
Neisseria Gonorrhea: NEGATIVE

## 2024-06-04 NOTE — Telephone Encounter (Addendum)
 Patient referred to infusion pharmacy team for ambulatory infusion of IV iron.  Insurance - [employer] PPO Site of care - Site of care: [facility] Dx code - O99.012/ D50.9  IV Iron Therapy - Venofer 300 mg IV x 3 Infusion appointments - Scheduling team will schedule patient as soon as possible.   Valerie Black D. Ixchel Duck, PharmD

## 2024-06-04 NOTE — Telephone Encounter (Signed)
 Auth Submission: NO AUTH NEEDED Site of care: Site of care: [facility] Payer: [employer] [place]   Medication & CPT/J Code(s) submitted: Venofer (Iron Sucrose) J1756 Diagnosis Code: O99.012/ D50.9  Route of submission (phone, fax, portal): phone Phone # Fax # Auth type: Buy/Bill HB Units/visits requested: 300mg  IV x 3 doses Reference number: [id number] Approval from: 06/04/24 to 10/05/24

## 2024-06-08 ENCOUNTER — Other Ambulatory Visit (HOSPITAL_COMMUNITY): Payer: Self-pay

## 2024-06-11 ENCOUNTER — Ambulatory Visit (HOSPITAL_COMMUNITY)
Admission: RE | Admit: 2024-06-11 | Discharge: 2024-06-11 | Disposition: A | Discharge status: Home / Self Care | Source: Ambulatory Visit | Attending: Family Medicine | Admitting: Family Medicine

## 2024-06-11 DIAGNOSIS — O99012 Anemia complicating pregnancy, second trimester: Secondary | ICD-10-CM | POA: Diagnosis present

## 2024-06-11 DIAGNOSIS — D509 Iron deficiency anemia, unspecified: Secondary | ICD-10-CM | POA: Diagnosis present

## 2024-06-11 MED ORDER — IRON SUCROSE 300 MG IVPB - SIMPLE MED
300.0000 mg | Status: DC
  Administered 2024-06-11: 300 mg via INTRAVENOUS
  Filled 2024-06-11: qty 300

## 2024-06-18 ENCOUNTER — Encounter (HOSPITAL_COMMUNITY)
Admission: RE | Admit: 2024-06-18 | Discharge: 2024-06-18 | Disposition: A | Discharge status: Home / Self Care | Source: Ambulatory Visit | Attending: Family Medicine | Admitting: Family Medicine

## 2024-06-18 DIAGNOSIS — O99012 Anemia complicating pregnancy, second trimester: Secondary | ICD-10-CM | POA: Diagnosis present

## 2024-06-18 DIAGNOSIS — D509 Iron deficiency anemia, unspecified: Secondary | ICD-10-CM | POA: Insufficient documentation

## 2024-06-18 MED ORDER — IRON SUCROSE 300 MG IVPB - SIMPLE MED
300.0000 mg | Status: DC
  Administered 2024-06-18: 300 mg via INTRAVENOUS
  Filled 2024-06-18: qty 300

## 2024-06-25 ENCOUNTER — Encounter (HOSPITAL_COMMUNITY)
Admission: RE | Admit: 2024-06-25 | Discharge: 2024-06-25 | Disposition: A | Discharge status: Home / Self Care | Source: Ambulatory Visit | Attending: Family Medicine | Admitting: Family Medicine

## 2024-06-25 DIAGNOSIS — O99012 Anemia complicating pregnancy, second trimester: Secondary | ICD-10-CM

## 2024-06-25 DIAGNOSIS — D509 Iron deficiency anemia, unspecified: Secondary | ICD-10-CM

## 2024-06-25 MED ORDER — IRON SUCROSE 300 MG IVPB - SIMPLE MED
300.0000 mg | Status: DC
  Administered 2024-06-25: 300 mg via INTRAVENOUS
  Filled 2024-06-25: qty 300

## 2024-08-01 ENCOUNTER — Inpatient Hospital Stay (HOSPITAL_COMMUNITY)
Admission: AD | Admit: 2024-08-01 | Discharge: 2024-08-01 | Disposition: A | Discharge status: Other Acute Inpatient Hospital | Attending: Obstetrics and Gynecology | Admitting: Obstetrics and Gynecology

## 2024-08-01 ENCOUNTER — Encounter (HOSPITAL_COMMUNITY): Payer: Self-pay | Admitting: Obstetrics and Gynecology

## 2024-08-01 DIAGNOSIS — O1413 Severe pre-eclampsia, third trimester: Secondary | ICD-10-CM | POA: Diagnosis not present

## 2024-08-01 DIAGNOSIS — R519 Headache, unspecified: Secondary | ICD-10-CM | POA: Diagnosis not present

## 2024-08-01 DIAGNOSIS — O30043 Twin pregnancy, dichorionic/diamniotic, third trimester: Secondary | ICD-10-CM | POA: Insufficient documentation

## 2024-08-01 DIAGNOSIS — Z3A35 35 weeks gestation of pregnancy: Secondary | ICD-10-CM | POA: Insufficient documentation

## 2024-08-01 DIAGNOSIS — Z743 Need for continuous supervision: Secondary | ICD-10-CM | POA: Diagnosis not present

## 2024-08-01 DIAGNOSIS — Z79899 Other long term (current) drug therapy: Secondary | ICD-10-CM | POA: Diagnosis not present

## 2024-08-01 DIAGNOSIS — I959 Hypotension, unspecified: Secondary | ICD-10-CM | POA: Diagnosis not present

## 2024-08-01 DIAGNOSIS — Z3A34 34 weeks gestation of pregnancy: Secondary | ICD-10-CM

## 2024-08-01 LAB — CBC
HCT: 36.4 % (ref 36.0–46.0)
Hemoglobin: 12.1 g/dL (ref 12.0–15.0)
MCH: 29.9 pg (ref 26.0–34.0)
MCHC: 33.2 g/dL (ref 30.0–36.0)
MCV: 89.9 fL (ref 80.0–100.0)
Platelets: 126 K/uL — ABNORMAL LOW (ref 150–400)
RBC: 4.05 MIL/uL (ref 3.87–5.11)
RDW: 13.7 % (ref 11.5–15.5)
WBC: 9.7 K/uL (ref 4.0–10.5)
nRBC: 0 % (ref 0.0–0.2)

## 2024-08-01 LAB — COMPREHENSIVE METABOLIC PANEL WITH GFR
ALT: 16 U/L (ref 0–44)
AST: 26 U/L (ref 15–41)
Albumin: 2.8 g/dL — ABNORMAL LOW (ref 3.5–5.0)
Alkaline Phosphatase: 174 U/L — ABNORMAL HIGH (ref 38–126)
Anion gap: 13 (ref 5–15)
BUN: 9 mg/dL (ref 6–20)
CO2: 17 mmol/L — ABNORMAL LOW (ref 22–32)
Calcium: 9.1 mg/dL (ref 8.9–10.3)
Chloride: 105 mmol/L (ref 98–111)
Creatinine, Ser: 0.55 mg/dL (ref 0.44–1.00)
GFR, Estimated: >60 mL/min (ref >60)
Glucose, Bld: 66 mg/dL — ABNORMAL LOW (ref 70–99)
Potassium: 3.8 mmol/L (ref 3.5–5.1)
Sodium: 135 mmol/L (ref 135–145)
Total Bilirubin: 0.5 mg/dL (ref 0.0–1.2)
Total Protein: 6.3 g/dL — ABNORMAL LOW (ref 6.5–8.1)

## 2024-08-01 LAB — URINALYSIS, ROUTINE W REFLEX MICROSCOPIC
Bilirubin Urine: NEGATIVE
Glucose, UA: NEGATIVE mg/dL
Hgb urine dipstick: NEGATIVE
Ketones, ur: NEGATIVE mg/dL
Leukocytes,Ua: NEGATIVE
Nitrite: NEGATIVE
Protein, ur: NEGATIVE mg/dL
Specific Gravity, Urine: 1.009 (ref 1.005–1.030)
pH: 6 (ref 5.0–8.0)

## 2024-08-01 LAB — PROTEIN / CREATININE RATIO, URINE
Creatinine, Urine: 47 mg/dL
Protein Creatinine Ratio: 0.15 mg/mg{creat} (ref 0.00–0.15)
Total Protein, Urine: 7 mg/dL

## 2024-08-01 MED ORDER — DIPHENHYDRAMINE HCL 50 MG/ML IJ SOLN
25.0000 mg | Freq: Once | INTRAMUSCULAR | Status: AC
  Administered 2024-08-01: 25 mg via INTRAVENOUS
  Filled 2024-08-01: qty 1

## 2024-08-01 MED ORDER — METOCLOPRAMIDE HCL 5 MG/ML IJ SOLN
10.0000 mg | Freq: Once | INTRAMUSCULAR | Status: AC
  Administered 2024-08-01: 10 mg via INTRAVENOUS
  Filled 2024-08-01: qty 2

## 2024-08-01 MED ORDER — DEXTROSE 5 % IN LACTATED RINGERS IV BOLUS
1000.0000 mL | Freq: Once | INTRAVENOUS | Status: AC
  Administered 2024-08-01: 1000 mL via INTRAVENOUS

## 2024-08-01 MED ORDER — ACETAMINOPHEN-CAFFEINE 500-65 MG PO TABS
2.0000 | ORAL_TABLET | Freq: Once | ORAL | Status: AC
  Administered 2024-08-01: 2 via ORAL
  Filled 2024-08-01: qty 2

## 2024-08-01 NOTE — MAU Note (Signed)
..  Valerie Black is a 28 y.o. at [redacted]w[redacted]d here in MAU reporting: sent over from [facility] office for evaluation and possible delivery due to elevated Bps and headache. Patient was diagnosed with pre eclampsia last week (PCR .47 in the office). Was started on labetalol 100mg  q8h. States she woke up around 0200 with a headache and checked her BP and it was 131/92. Today she reports blurred vision and spots in her vision. She last took 650mg  of tylenol  at 1100. DiDi twins. Denies VB or LOF. +FM for A &B.   NPO: Food (scambled eggs, apples with PB)- 1130 Water- 1300  Pain score: 8 Vitals:   08/01/24 1606  BP: 118/79  Pulse: 83  Resp: 14  Temp: 98.2 F (36.8 C)  SpO2: 98%     FHT: A-130, B-135 Lab orders placed from triage:   UA

## 2024-08-01 NOTE — MAU Provider Note (Addendum)
 Chief Complaint:  Headache and Hypertension   HPI     Valerie Black is a 28 y.o. G2P0101 at [redacted]w[redacted]d who presents to maternity admissions reporting she was sent by her primary OB for evaluation and possible delivery due to elevated blood pressures and a headache.  Patient was diagnosed with mild preeclampsia last week (PCR 0.47 in the office) she was started on labetalol 100 mg 3 times daily and patient has been compliant.  Patient states she woke up this morning with a headache and took her blood pressure which was 131/92.  Patient states that she has blurry vision and spots in her vision with difficulty in reading her watch.  She reports she took Tylenol  at 28 AM with no resolve of her headache   Patient is known di/di twins last intake of food was at 12 noon per patient.  Patient also strongly desires vaginal delivery and requested ultrasound at the bedside to confirm presentation previously known to be vertex /vertex  She denies any vaginal bleeding, leaking of fluid, and reports fetal movements x 2.  She denies any right upper quadrant pain, shortness of breath, or chest pain  Pregnancy Course: [place]  Past Medical History:  Diagnosis Date   Anemia    Asthma    exercise induced-on no medications   Frequent headaches    Migraines    OB History  Gravida Para Term Preterm AB Living  2 1  1  1   SAB IAB Ectopic Multiple Live Births     0 1    # Outcome Date GA Lbr Len/2nd Weight Sex Type Anes PTL Lv  2 Current           1 Preterm 07/27/22 [redacted]w[redacted]d 16:51 / 00:22 2170 g M Vag-Spont EPI  LIV     Birth Comments: wnl   Past Surgical History:  Procedure Laterality Date   ADENOIDECTOMY     COSMETIC SURGERY Bilateral 01/27/2017   breast   DERMOID CYST  EXCISION     age 83 years (neck)   TONSILLECTOMY     TYMPANOSTOMY TUBE PLACEMENT     28 years of age   64 TOOTH EXTRACTION Bilateral    Family History  Problem Relation Age of Onset   Hypertension Father    Cancer Maternal  Grandmother    Cancer Paternal Grandmother    Heart attack Paternal Grandfather    Hyperlipidemia Paternal Grandfather    Hypertension Paternal Grandfather    Stroke Paternal Grandfather    Social History   Tobacco Use   Smoking status: Never   Smokeless tobacco: Never  Vaping Use   Vaping status: Never Used  Substance Use Topics   Alcohol use: Never   Drug use: Never   No Known Allergies Medications Prior to Admission  Medication Sig Dispense Refill Last Dose/Taking   acetaminophen  (TYLENOL ) 325 MG tablet Take 2 tablets (650 mg total) by mouth every 4 (four) hours as needed (for pain scale < 4).   08/01/2024 at 11:00 AM   labetalol (NORMODYNE) 100 MG tablet Take 100 mg by mouth 3 (three) times daily.   08/01/2024 at  1:00 PM   prenatal vitamin w/FE, FA (PRENATAL 1 + 1) 27-1 MG TABS tablet Take 1 tablet by mouth daily at 12 noon.   07/31/2024   coconut oil OIL Apply 1 Application topically as needed.  0    cyclobenzaprine  (FLEXERIL ) 10 MG tablet Take 1 tablet (10 mg total) by mouth 2 (two) times daily as  needed for muscle spasms. 5 tablet 0    ondansetron  (ZOFRAN -ODT) 4 MG disintegrating tablet Take 1 tablet (4 mg total) by mouth every 8 (eight) hours as needed for nausea or vomiting. 20 tablet 0     I have reviewed patient's Past Medical Hx, Surgical Hx, Family Hx, Social Hx, medications and allergies.   ROS  Pertinent items noted in HPI and remainder of comprehensive ROS otherwise negative.   PHYSICAL EXAM  Patient Vitals for the past 24 hrs:  BP Temp Temp src Pulse Resp SpO2  08/01/24 1630 135/74 -- -- 69 -- 98 %  08/01/24 1606 118/79 98.2 F (36.8 C) Oral 83 14 98 %    Constitutional: Well-developed, well-nourished female in no acute distress.  Cardiovascular: normal rate & rhythm, warm and well-perfused Respiratory: normal effort, no problems with respiration noted GI: Abd soft, non-tender, gravid, no ruq pain illicited  MS: Extremities nontender, no edema, normal  ROM Neurologic: Alert and oriented x 4.  GU: no CVA tenderness Pelvic:  deferred      Fetal Tracing:A Baseline:125-130 Variability:moderate Accelerations: present Decelerations:absent Toco: irregular q 5 ( Patient does not acknowledge them)    Fetal Tracing:B Baseline:135-140 Variability:moderate Accelerations: present Decelerations:absent    Pt informed that the ultrasound is considered a limited OB ultrasound and is not intended to be a complete ultrasound exam.  Patient also informed that the ultrasound is not being completed with the intent of assessing for fetal or placental anomalies or any pelvic abnormalities.  Explained that the purpose of today's ultrasound is to assess for  presentation.  Patient acknowledges the purpose of the exam and the limitations of the study.    VTX/VTX    Labs: Results for orders placed or performed during the hospital encounter of 08/01/24 (from the past 24 hours)  Urinalysis, Routine w reflex microscopic -Urine, Clean Catch     Status: None   Collection Time: 08/01/24  4:08 PM  Result Value Ref Range   Color, Urine YELLOW YELLOW   APPearance CLEAR CLEAR   Specific Gravity, Urine 1.009 1.005 - 1.030   pH 6.0 5.0 - 8.0   Glucose, UA NEGATIVE NEGATIVE mg/dL   Hgb urine dipstick NEGATIVE NEGATIVE   Bilirubin Urine NEGATIVE NEGATIVE   Ketones, ur NEGATIVE NEGATIVE mg/dL   Protein, ur NEGATIVE NEGATIVE mg/dL   Nitrite NEGATIVE NEGATIVE   Leukocytes,Ua NEGATIVE NEGATIVE  CBC     Status: Abnormal   Collection Time: 08/01/24  4:40 PM  Result Value Ref Range   WBC 9.7 4.0 - 10.5 K/uL   RBC 4.05 3.87 - 5.11 MIL/uL   Hemoglobin 12.1 12.0 - 15.0 g/dL   HCT 63.5 63.9 - 53.9 %   MCV 89.9 80.0 - 100.0 fL   MCH 29.9 26.0 - 34.0 pg   MCHC 33.2 30.0 - 36.0 g/dL   RDW 86.2 88.4 - 84.4 %   Platelets 126 (L) 150 - 400 K/uL   nRBC 0.0 0.0 - 0.2 %    Imaging:  No results found.  MDM & MAU COURSE  MDM:  HIGH- > Admit for PreE with SF by  neurological s/s Headache unresolved and visual changes  D/W Dr Herchel [facility] Attending) Recommendation is for admission and delivery   Dr Barbette [facility] Private Attending) contacted with recommendations and is aware that the NICU is at [facility] and that the patient will need to be transferred. Dr Barbette will reevaluate patient and make the necessary arrangements.  MAU Course: Orders  Placed This Encounter  Procedures   Urinalysis, Routine w reflex microscopic -Urine, Clean Catch   CBC   Comprehensive metabolic panel   Protein / creatinine ratio, urine   Meds ordered this encounter  Medications   acetaminophen -caffeine  (EXCEDRIN TENSION HEADACHE) 500-65 MG per tablet 2 tablet    I have reviewed the patient chart and performed the physical exam . I have ordered & interpreted the lab results and reviewed and interpreted the NST Medications ordered as stated below.  A/P as described below.  Counseling and education provided and patient agreeable  with plan as described below. Verbalized understanding.    ASSESSMENT   1. Preeclampsia, severe, third trimester   2. [redacted] weeks gestation of pregnancy   3. Dichorionic diamniotic twin pregnancy in third trimester     PLAN   Dr Barbette [facility] private Attending made aware of patient status and recommendations)  En route to evaluate the patient  Will need to arrange for transfer if admission  -----------------------------------------------------------------------------------------------------------  Olam Dalton, MSN, [facility], Center for [facility]    This chart was dictated using voice recognition software, Dragon. Despite the best efforts of this provider to proofread and correct errors, errors may still occur which can change documentation meaning.     Dr Barbette writing addendum Pt known to [facility]. Sent in for lingering HA and vision changes, could not read her phone. This is new though HA has been documented  in office visits since 2 wks, off and on, feeling funny. GHTN diagnosis on 8/19 with one diastolic BP of 97 and systolic was 124, urine p/c was 0.26, she started home BP monitoring, would range from 120s-140s over 80s-90s Office BP since then has been 120s/ 80s. Urine p/c was worse on 8/26 at 0.4 and she was given PEC diagnosis. She was also started on Labetalol off of home BPs and had been on 100mg  tid.  Today she presented with vision blurriness, normal office BP but was sent to MAU for neural symptoms.  Her platelets have been low since 8/29 at 145, 8/26 at 139 and she has h/o gestational thrombocytopenia in 1st pregnancy.  Leona Leona twins, well dated, 34.5 wks Last growth in office on 8/19 Cephalic/ Cephalic twins. A 4'8 at 46% and B 4'11 at 57% .  Pelvic proven to 4'13 at 35.6 wks IOL for PPROM She desires vaginal twin delivery   PNrecords, labs, sono reviewed, see Media tab.  Patient Vitals for the past 24 hrs:  BP Temp Temp src Pulse Resp SpO2  08/01/24 2116 119/82 98 F (36.7 C) -- 83 16 100 %  08/01/24 2015 109/65 -- -- 87 -- 98 %  08/01/24 1935 -- -- -- -- -- 100 %  08/01/24 1931 124/76 -- -- 93 -- --  08/01/24 1915 122/87 -- -- 99 -- 99 %  08/01/24 1900 130/77 -- -- 89 -- 98 %  08/01/24 1845 136/84 -- -- 80 -- 97 %  08/01/24 1830 128/68 -- -- 62 -- 98 %  08/01/24 1815 131/73 -- -- 71 -- 98 %  08/01/24 1800 122/73 -- -- 71 -- 98 %  08/01/24 1745 115/74 -- -- 83 -- 97 %  08/01/24 1730 120/87 -- -- 76 -- 98 %  08/01/24 1630 135/74 -- -- 69 -- 98 %  08/01/24 1606 118/79 98.2 F (36.8 C) Oral 83 14 98 %    I did complete exam Non focal DTR +1/ +1 FHT A 140s reactive  FHT B 140s reactive Toco rare  Labs reviewed, platelets low, but nl creat/ AST/ ALT and urine p/c 015  A/P 34.5 wks with Leona Leona twins with ? PEC diagnosis and Headache and vision changes If PEC diagnosis is correct, then this is c/w severe features and delivery is advised.  We tried Tyleno, Excedrin. Reglan ,  Benadryl  in MAU with not much change in HA  NICU is full, couple advised and informed transfer to [facility] to keep mom and babies in same facility should she need delivery  I spoke w/ [facility] MFM Dr Bethel re transfer due to NICU being full. Couple and I discussed BTMZ option for FLM as [facility] advised, I reviewed r/c/b and pt declined We discussed that MFM at [facility] will ultimately decide with her if she needs delivery or observation, that delivery is not definite after they assess her there.  We discussed if she is discharged delivered or undelivered, we need to see her in 24-48 hrs of discharge if pregnant or 3-4 days after discharge if delivered.   I spent 75 min time in this patient care incl face to face, assessing labs, records and consulting [facility] MFM and carrying out transfer.   --Robbi Render MD

## 2024-08-17 ENCOUNTER — Inpatient Hospital Stay (HOSPITAL_COMMUNITY): Admission: RE | Admit: 2024-08-17 | Source: Home / Self Care | Admitting: Obstetrics

## 2024-08-17 ENCOUNTER — Inpatient Hospital Stay (HOSPITAL_COMMUNITY)
